# Patient Record
Sex: Female | Born: 1950 | Race: White | Hispanic: No | Marital: Married | Smoking: Never smoker
Health system: Southern US, Community
[De-identification: ages and names within clinical notes are randomized; demographics above are authoritative.]

## PROBLEM LIST (undated history)

## (undated) DIAGNOSIS — M069 Rheumatoid arthritis, unspecified: Secondary | ICD-10-CM

## (undated) DIAGNOSIS — B029 Zoster without complications: Secondary | ICD-10-CM

## (undated) DIAGNOSIS — M419 Scoliosis, unspecified: Secondary | ICD-10-CM

## (undated) DIAGNOSIS — K219 Gastro-esophageal reflux disease without esophagitis: Secondary | ICD-10-CM

## (undated) DIAGNOSIS — I10 Essential (primary) hypertension: Secondary | ICD-10-CM

## (undated) DIAGNOSIS — K623 Rectal prolapse: Secondary | ICD-10-CM

## (undated) DIAGNOSIS — M8580 Other specified disorders of bone density and structure, unspecified site: Secondary | ICD-10-CM

## (undated) DIAGNOSIS — S42401A Unspecified fracture of lower end of right humerus, initial encounter for closed fracture: Secondary | ICD-10-CM

## (undated) DIAGNOSIS — N390 Urinary tract infection, site not specified: Secondary | ICD-10-CM

## (undated) DIAGNOSIS — M1990 Unspecified osteoarthritis, unspecified site: Secondary | ICD-10-CM

## (undated) HISTORY — DX: Urinary tract infection, site not specified: N39.0

## (undated) HISTORY — DX: Rectal prolapse: K62.3

## (undated) HISTORY — DX: Gastro-esophageal reflux disease without esophagitis: K21.9

## (undated) HISTORY — DX: Scoliosis, unspecified: M41.9

## (undated) HISTORY — DX: Rheumatoid arthritis, unspecified: M06.9

## (undated) HISTORY — PX: ESOPHAGOGASTRODUODENOSCOPY: SHX1529

## (undated) HISTORY — DX: Zoster without complications: B02.9

## (undated) HISTORY — DX: Other specified disorders of bone density and structure, unspecified site: M85.80

## (undated) HISTORY — PX: COLONOSCOPY: SHX174

---

## 2008-04-28 ENCOUNTER — Encounter: Admission: RE | Admit: 2008-04-28 | Discharge: 2008-04-28 | Payer: Self-pay | Admitting: Internal Medicine

## 2009-05-01 ENCOUNTER — Encounter: Admission: RE | Admit: 2009-05-01 | Discharge: 2009-05-01 | Payer: Self-pay | Admitting: *Deleted

## 2010-03-26 ENCOUNTER — Other Ambulatory Visit: Payer: Self-pay | Admitting: Internal Medicine

## 2010-03-26 DIAGNOSIS — Z1239 Encounter for other screening for malignant neoplasm of breast: Secondary | ICD-10-CM

## 2010-05-06 ENCOUNTER — Ambulatory Visit
Admission: RE | Admit: 2010-05-06 | Discharge: 2010-05-06 | Disposition: A | Discharge status: Home / Self Care | Payer: BC Managed Care – PPO | Source: Ambulatory Visit | Attending: Internal Medicine | Admitting: Internal Medicine

## 2010-05-06 DIAGNOSIS — Z1239 Encounter for other screening for malignant neoplasm of breast: Secondary | ICD-10-CM

## 2011-04-02 ENCOUNTER — Other Ambulatory Visit: Payer: Self-pay | Admitting: Internal Medicine

## 2011-04-02 DIAGNOSIS — Z1231 Encounter for screening mammogram for malignant neoplasm of breast: Secondary | ICD-10-CM

## 2011-05-07 ENCOUNTER — Ambulatory Visit: Payer: BC Managed Care – PPO

## 2011-05-14 ENCOUNTER — Ambulatory Visit
Admission: RE | Admit: 2011-05-14 | Discharge: 2011-05-14 | Disposition: A | Discharge status: Home / Self Care | Payer: BC Managed Care – PPO | Source: Ambulatory Visit | Attending: Internal Medicine | Admitting: Internal Medicine

## 2011-05-14 DIAGNOSIS — Z1231 Encounter for screening mammogram for malignant neoplasm of breast: Secondary | ICD-10-CM

## 2012-04-07 ENCOUNTER — Other Ambulatory Visit: Payer: Self-pay | Admitting: Internal Medicine

## 2012-04-07 DIAGNOSIS — Z1231 Encounter for screening mammogram for malignant neoplasm of breast: Secondary | ICD-10-CM

## 2012-05-17 ENCOUNTER — Ambulatory Visit
Admission: RE | Admit: 2012-05-17 | Discharge: 2012-05-17 | Disposition: A | Discharge status: Home / Self Care | Payer: BC Managed Care – PPO | Source: Ambulatory Visit | Attending: Internal Medicine | Admitting: Internal Medicine

## 2012-05-17 DIAGNOSIS — Z1231 Encounter for screening mammogram for malignant neoplasm of breast: Secondary | ICD-10-CM

## 2013-04-20 ENCOUNTER — Other Ambulatory Visit: Payer: Self-pay

## 2013-04-20 DIAGNOSIS — Z1231 Encounter for screening mammogram for malignant neoplasm of breast: Secondary | ICD-10-CM

## 2013-05-18 ENCOUNTER — Ambulatory Visit
Admission: RE | Admit: 2013-05-18 | Discharge: 2013-05-18 | Disposition: A | Discharge status: Home / Self Care | Payer: BC Managed Care – PPO | Source: Ambulatory Visit

## 2013-05-18 DIAGNOSIS — Z1231 Encounter for screening mammogram for malignant neoplasm of breast: Secondary | ICD-10-CM

## 2014-04-18 ENCOUNTER — Other Ambulatory Visit: Payer: Self-pay

## 2014-04-18 DIAGNOSIS — Z1231 Encounter for screening mammogram for malignant neoplasm of breast: Secondary | ICD-10-CM

## 2014-05-30 ENCOUNTER — Ambulatory Visit
Admission: RE | Admit: 2014-05-30 | Discharge: 2014-05-30 | Disposition: A | Discharge status: Home / Self Care | Payer: BC Managed Care – PPO | Source: Ambulatory Visit

## 2014-05-30 ENCOUNTER — Encounter (INDEPENDENT_AMBULATORY_CARE_PROVIDER_SITE_OTHER): Payer: Self-pay

## 2014-05-30 DIAGNOSIS — Z1231 Encounter for screening mammogram for malignant neoplasm of breast: Secondary | ICD-10-CM

## 2014-10-17 ENCOUNTER — Other Ambulatory Visit: Payer: Self-pay | Admitting: Gastroenterology

## 2014-10-17 DIAGNOSIS — Z1211 Encounter for screening for malignant neoplasm of colon: Secondary | ICD-10-CM

## 2014-11-10 ENCOUNTER — Other Ambulatory Visit: Payer: BC Managed Care – PPO

## 2015-04-25 ENCOUNTER — Other Ambulatory Visit: Payer: Self-pay

## 2015-04-25 DIAGNOSIS — Z1231 Encounter for screening mammogram for malignant neoplasm of breast: Secondary | ICD-10-CM

## 2015-06-06 ENCOUNTER — Ambulatory Visit
Admission: RE | Admit: 2015-06-06 | Discharge: 2015-06-06 | Disposition: A | Discharge status: Home / Self Care | Payer: BC Managed Care – PPO | Source: Ambulatory Visit

## 2015-06-06 DIAGNOSIS — Z1231 Encounter for screening mammogram for malignant neoplasm of breast: Secondary | ICD-10-CM

## 2015-06-07 ENCOUNTER — Ambulatory Visit: Payer: BC Managed Care – PPO

## 2016-02-04 ENCOUNTER — Encounter (HOSPITAL_COMMUNITY): Payer: Self-pay

## 2016-02-04 ENCOUNTER — Emergency Department (HOSPITAL_COMMUNITY)
Admission: EM | Admit: 2016-02-04 | Discharge: 2016-02-04 | Disposition: A | Discharge status: Home / Self Care | Payer: BC Managed Care – PPO | Attending: Emergency Medicine | Admitting: Emergency Medicine

## 2016-02-04 ENCOUNTER — Emergency Department (HOSPITAL_COMMUNITY): Payer: BC Managed Care – PPO

## 2016-02-04 DIAGNOSIS — S6991XA Unspecified injury of right wrist, hand and finger(s), initial encounter: Secondary | ICD-10-CM | POA: Diagnosis present

## 2016-02-04 DIAGNOSIS — W1839XA Other fall on same level, initial encounter: Secondary | ICD-10-CM | POA: Diagnosis not present

## 2016-02-04 DIAGNOSIS — S61412A Laceration without foreign body of left hand, initial encounter: Secondary | ICD-10-CM | POA: Diagnosis not present

## 2016-02-04 DIAGNOSIS — Y9389 Activity, other specified: Secondary | ICD-10-CM | POA: Insufficient documentation

## 2016-02-04 DIAGNOSIS — I10 Essential (primary) hypertension: Secondary | ICD-10-CM | POA: Diagnosis not present

## 2016-02-04 DIAGNOSIS — Y9289 Other specified places as the place of occurrence of the external cause: Secondary | ICD-10-CM | POA: Diagnosis not present

## 2016-02-04 DIAGNOSIS — Y999 Unspecified external cause status: Secondary | ICD-10-CM | POA: Insufficient documentation

## 2016-02-04 DIAGNOSIS — S52601S Unspecified fracture of lower end of right ulna, sequela: Secondary | ICD-10-CM

## 2016-02-04 DIAGNOSIS — S52691A Other fracture of lower end of right ulna, initial encounter for closed fracture: Secondary | ICD-10-CM | POA: Diagnosis not present

## 2016-02-04 HISTORY — DX: Unspecified osteoarthritis, unspecified site: M19.90

## 2016-02-04 HISTORY — DX: Essential (primary) hypertension: I10

## 2016-02-04 MED ORDER — IBUPROFEN 400 MG PO TABS
400.0000 mg | ORAL_TABLET | Freq: Once | ORAL | Status: DC
Start: 2016-02-04 — End: 2016-02-04

## 2016-02-04 MED ORDER — ACETAMINOPHEN 325 MG PO TABS
650.0000 mg | ORAL_TABLET | Freq: Once | ORAL | Status: AC
  Administered 2016-02-04: 650 mg via ORAL
  Filled 2016-02-04: qty 2

## 2016-02-04 NOTE — ED Triage Notes (Signed)
Pt.S husband  Fel off a ladder and pt.S was attempting to help him and she fell backward trying to avoid getting hit by the ladder. Rt, hip pain, rt. Wrist pain and lt. Fingers are stiff.   Pt. Denies hitting her head.  No visible injuries noted.  Pt. Has 2 lt. Thumb skin tears.

## 2016-02-04 NOTE — ED Provider Notes (Signed)
[facility] DEPT Provider Note   CSN: [id number] Arrival date & time: 02/04/16  1508     History   Chief Complaint Chief Complaint  Patient presents with  . Fall    HPI Sylvia Reynolds is a 65 y.o. female hx of HTN, Who presented with fall. Patient states that she was helping her husband with some roof work. She states that her husband fell from the roof and she was holding the ladder down there and they both fell and she landed on outstretched hand and right hip. She denies passing out or lost consciousness or head injury. She states that she has chronic pain from her arthritis but she does have some right hip pain and back pain when she walks. She also noticed skin tears on the left hand and right wrist pain. Patient states that her tetanus is up-to-date.   The history is provided by the patient.    Past Medical History:  Diagnosis Date  . Arthritis   . Hypertension     There are no active problems to display for this patient.   History reviewed. No pertinent surgical history.  OB History    No data available       Home Medications    Prior to Admission medications   Not on File    Family History No family history on file.  Social History Social History  Substance Use Topics  . Smoking status: Never Smoker  . Smokeless tobacco: Never Used  . Alcohol use Yes     Comment: occasional     Allergies   Patient has no known allergies.   Review of Systems Review of Systems  Musculoskeletal:       R hip and back pain   Skin: Positive for wound.  All other systems reviewed and are negative.    Physical Exam Updated Vital Signs BP 161/76 (BP Location: Left Arm)   Pulse 69   Temp 98 F (36.7 C) (Oral)   Resp 17   Ht 5' (1.524 m)   Wt 115 lb (52.2 kg)   SpO2 100%   BMI 22.46 kg/m   Physical Exam  Constitutional: She is oriented to person, place, and time. She appears well-developed and well-nourished.  HENT:  Head: Normocephalic and atraumatic.   Eyes: EOM are normal. Pupils are equal, round, and reactive to light.  Neck: Normal range of motion. Neck supple.  Cardiovascular: Normal rate, regular rhythm and normal heart sounds.   Pulmonary/Chest: Effort normal and breath sounds normal. No respiratory distress. She has no wheezes. She has no rales.  Abdominal: Soft. Bowel sounds are normal. She exhibits no distension. There is no tenderness. There is no guarding.  Musculoskeletal:  L hand small skin tears that is not bleeding. R wrist mild tenderness, no obvious deformity. R paralumbar tenderness. Able to ambulate, nl ROM R hip   Neurological: She is alert and oriented to person, place, and time.  Skin: Skin is warm.  Psychiatric: She has a normal mood and affect.  Nursing note and vitals reviewed.    ED Treatments / Results  Labs (all labs ordered are listed, but only abnormal results are displayed) Labs Reviewed - No data to display  EKG  EKG Interpretation None       Radiology Dg Lumbar Spine Complete  Result Date: 02/04/2016 CLINICAL DATA:  Pt was holding latter for her husband when he fell off the latter. She fell back trying to get out of the way. She landed  on her lower back first. She mentioned feeling a lot of pain in her right buttock. She also landed with her hands out behind her hurting her right wrist which she said was beginning to hurt worse when she was brought in for the exam. He left hand has an abrasion around the MCP jt of the 2nd digit. She believe something may have fallen on or scrapped her left hand. EXAM: LUMBAR SPINE - COMPLETE 4+ VIEW COMPARISON:  None. FINDINGS: There is a grade 2 anterolisthesis of L5 on S1. There is no convincing pars defects although this is not excluded. The anterolisthesis is most likely degenerative in origin. There is a moderate levoscoliosis, apex at L2 with a dextroscoliosis, apex at L5. Mild loss of disc height at T12-L1. Moderate loss disc height at L1-L2. Marked loss of  disc height at L2-L3. Moderate to marked loss of disc height at L3-L4 and L4-L5. Moderate loss of disc height at L5-S1. Bones are diffusely demineralized. Soft tissues are unremarkable. IMPRESSION: 1. No fracture or convincing acute abnormality. 2. Scoliosis and significant degenerative changes as detailed above. Electronically Signed   By: Lajean Manes M.D.   On: 02/04/2016 18:14   Dg Wrist Complete Right  Result Date: 02/04/2016 CLINICAL DATA:  Pt was holding latter for her husband when he fell off the latter. She fell back trying to get out of the way. She landed on her lower back first. She mentioned feeling a lot of pain in her right buttock. She also landed with her hands out behind her hurting her right wrist which she said was beginning to hurt worse when she was brought in for the exam. He left hand has an abrasion around the MCP jt of the 2nd digit. She believe something may have fallen on or scrapped her left hand. EXAM: RIGHT WRIST - COMPLETE 3+ VIEW COMPARISON:  None. FINDINGS: No convincing acute fracture.  No dislocation. There is narrowing of the radial scaphoid and radiolunate joints with mild subchondral sclerosis. There is some sclerosis in remottling at the radioulnar articulation. There is mild cystic change in distal radius and the ulna. The bones are diffusely demineralized. There is evidence of soft tissue swelling over the dorsal aspect of the wrist. IMPRESSION: 1. No convincing acute fracture.  No dislocation. 2. Degenerative changes of the radiocarpal joint and at the radial ulnar articulation. Some deformity of the distal ulna suggests a possible old, healed fracture. Electronically Signed   By: Lajean Manes M.D.   On: 02/04/2016 18:10   Dg Hand Complete Left  Result Date: 02/04/2016 CLINICAL DATA:  Pt was holding latter for her husband when he fell off the latter. She fell back trying to get out of the way. She landed on her lower back first. She mentioned feeling a lot of pain  in her right buttock. She also landed with her hands out behind her hurting her right wrist which she said was beginning to hurt worse when she was brought in for the exam. He left hand has an abrasion around the MCP jt of the 2nd digit. She believe something may have fallen on or scrapped her left hand. EXAM: LEFT HAND - COMPLETE 3+ VIEW COMPARISON:  None. FINDINGS: No acute fracture. Deformity of the distal fifth metacarpal is likely from an old healed fracture. No dislocation. There is narrowing and subchondral sclerosis at the first carpometacarpal articulation consistent with osteoarthritis. Bones are demineralized.  The soft tissues are unremarkable. IMPRESSION: No acute fracture, dislocation or  acute finding. Electronically Signed   By: Lajean Manes M.D.   On: 02/04/2016 18:07   Dg Hip Unilat W Or Wo Pelvis 2-3 Views Right  Result Date: 02/04/2016 CLINICAL DATA:  Pt was holding latter for her husband when he fell off the latter. She fell back trying to get out of the way. She landed on her lower back first. She mentioned feeling a lot of pain in her right buttock. She also landed with her hands out behind her hurting her right wrist which she said was beginning to hurt worse when she was brought in for the exam. He left hand has an abrasion around the MCP jt of the 2nd digit. She believe something may have fallen on or scrapped her left hand. EXAM: DG HIP (WITH OR WITHOUT PELVIS) 2-3V RIGHT COMPARISON:  None. FINDINGS: No fracture.  No bone lesion. Hip joints, SI joints and symphysis pubis are normally spaced and aligned. No significant arthropathic change. Bones are diffusely demineralized. Soft tissues are unremarkable. IMPRESSION: 1. No fracture or dislocation. Electronically Signed   By: Lajean Manes M.D.   On: 02/04/2016 18:15    Procedures Procedures (including critical care time)  Medications Ordered in ED Medications - No data to display   Initial Impression / Assessment and Plan / ED  Course  I have reviewed the triage vital signs and the nursing notes.  Pertinent labs & imaging results that were available during my care of the patient were reviewed by me and considered in my medical decision making (see chart for details).  Clinical Course     IGNACIA CRISS is a 65 y.o. female here with fall. I think likely contusion. No head injury. Will get xrays. Tdap up to date.  6:53 PM Xray showed old R ulna fracture. Given wrist splint for comfort. L thumb skin tears cleaned by nursing. Recommend tylenol, motrin, ice. Will dc home.    Final Clinical Impressions(s) / ED Diagnoses   Final diagnoses:  None    New Prescriptions New Prescriptions   No medications on file     Drenda Freeze, MD 02/04/16 [phone]

## 2016-02-04 NOTE — Progress Notes (Signed)
Orthopedic Tech Progress Note Patient Details:  Sylvia Reynolds 10-Jul-1950 [id number]  Ortho Devices Type of Ortho Device: Velcro wrist splint Ortho Device/Splint Location: LUE Ortho Device/Splint Interventions: Ordered, Application   Braulio Bosch 02/04/2016, 7:26 PM

## 2016-02-04 NOTE — ED Notes (Signed)
Pt walking up and down hallway appears in no distress

## 2016-02-04 NOTE — Discharge Instructions (Signed)
You had previous ulna fracture that is healed and no new fractures currently.  Wear the wrist splint for comfort.   Take motrin for pain. Apply ice on it.   See your doctor in a week if you still have pain and you may need repeat xray.  Keep  your wounds clean and dry.   Return to ER if you have severe pain, headaches, vomiting, unable to walk

## 2016-05-12 ENCOUNTER — Other Ambulatory Visit: Payer: Self-pay | Admitting: Internal Medicine

## 2016-05-12 DIAGNOSIS — Z1231 Encounter for screening mammogram for malignant neoplasm of breast: Secondary | ICD-10-CM

## 2016-05-27 ENCOUNTER — Ambulatory Visit: Payer: Self-pay | Admitting: Women's Health

## 2016-06-04 ENCOUNTER — Ambulatory Visit (INDEPENDENT_AMBULATORY_CARE_PROVIDER_SITE_OTHER): Payer: BC Managed Care – PPO | Admitting: Women's Health

## 2016-06-04 ENCOUNTER — Encounter: Payer: Self-pay | Admitting: Women's Health

## 2016-06-04 VITALS — BP 152/98 | Ht 59.75 in | Wt 120.6 lb

## 2016-06-04 DIAGNOSIS — Z01419 Encounter for gynecological examination (general) (routine) without abnormal findings: Secondary | ICD-10-CM

## 2016-06-04 NOTE — Progress Notes (Signed)
Sylvia Reynolds [birth year] [id number]    History:    66 year old new patient presents for annual exam. Postmenopausal on no HRT with no bleeding. Normal pap and mammography history. 2017 DEXA at primary care. 2016 colonoscopy normal. Received Pneumovax but not Zostavax because on Methotrexate for RA. Not  sexually active, husbands health.  Hypertension managed by primary care.  Past medical history, past surgical history, family history and social history were all reviewed and documented in the EPIC chart. Married for 43 years. Has twin girls 64 years old, Judson Roch and Benjamine Mola. Judson Roch expecting child in June, Elizabeth with cerebral palsy, W/C bound lives in a group home with help. Lost 27 pounds years ago on [employer]. Retired Pharmacist, hospital.  ROS:  A ROS was performed and pertinent positives and negatives are included.  Exam:  Vitals:   06/04/16 1055  BP: (!) 152/98  Weight: 120 lb 9.6 oz (54.7 kg)  Height: 4' 11.75" (1.518 m)   Body mass index is 23.75 kg/m.   General appearance:  Normal Thyroid:  Symmetrical, normal in size, without palpable masses or nodularity. Respiratory  Auscultation:  Clear without wheezing or rhonchi Cardiovascular  Auscultation:  Regular rate, without rubs, murmurs or gallops  Edema/varicosities:  Not grossly evident Abdominal  Soft,nontender, without masses, guarding or rebound.  Liver/spleen:  No organomegaly noted  Hernia:  None appreciated  Skin  Inspection:  Grossly normal   Breasts: Examined lying and sitting.     Right: Without masses, retractions, discharge or axillary adenopathy.     Left: Without masses, retractions, discharge or axillary adenopathy. Gentitourinary   Inguinal/mons:  Normal without inguinal adenopathy  External genitalia:  Normal  BUS/Urethra/Skene's glands:  Normal  Vagina:  Atrophic/asymptomatic  Cervix:  Normal  Uterus:  Normal in size, shape and contour.  Midline and mobile  Adnexa/parametria:     Rt: Without masses or  tenderness.   Lt: Without masses or tenderness.  Anus and perineum: Normal  Digital rectal exam: Normal sphincter tone without palpated masses or tenderness  Assessment/Plan:  66 y.o. MWF G1P2 for annual exam with no complaints  Postmenopausal/no HRT/no bleeding Rheumatoid Arthritis on methotrexate per rheumatologist Osteopenia/Hypertension labs and meds managed by primary care  Plan: Reviewed blood pressure elevated, will check at home follow-up with primary care. SBE's, continue annual 3-D screening mammogram history of dense breasts. Home safety, fall prevention and importance of weightbearing exercise reviewed. Vitamin D 2000 daily encouraged. Pap normal 2017, new screening guidelines reviewed. T dap recommended.    Huel Cote WHNP, 12:01 PM 06/04/2016

## 2016-06-04 NOTE — Patient Instructions (Signed)
Health Maintenance for Postmenopausal Women Menopause is a normal process in which your reproductive ability comes to an end. This process happens gradually over a span of months to years, usually between the ages of 33 and 38. Menopause is complete when you have missed 12 consecutive menstrual periods. It is important to talk with your health care provider about some of the most common conditions that affect postmenopausal women, such as heart disease, cancer, and bone loss (osteoporosis). Adopting a healthy lifestyle and getting preventive care can help to promote your health and wellness. Those actions can also lower your chances of developing some of these common conditions. What should I know about menopause? During menopause, you may experience a number of symptoms, such as:  Moderate-to-severe hot flashes.  Night sweats.  Decrease in sex drive.  Mood swings.  Headaches.  Tiredness.  Irritability.  Memory problems.  Insomnia. Choosing to treat or not to treat menopausal changes is an individual decision that you make with your health care provider. What should I know about hormone replacement therapy and supplements? Hormone therapy products are effective for treating symptoms that are associated with menopause, such as hot flashes and night sweats. Hormone replacement carries certain risks, especially as you become older. If you are thinking about using estrogen or estrogen with progestin treatments, discuss the benefits and risks with your health care provider. What should I know about heart disease and stroke? Heart disease, heart attack, and stroke become more likely as you age. This may be due, in part, to the hormonal changes that your body experiences during menopause. These can affect how your body processes dietary fats, triglycerides, and cholesterol. Heart attack and stroke are both medical emergencies. There are many things that you can do to help prevent heart disease  and stroke:  Have your blood pressure checked at least every 1-2 years. High blood pressure causes heart disease and increases the risk of stroke.  If you are 48-61 years old, ask your health care provider if you should take aspirin to prevent a heart attack or a stroke.  Do not use any tobacco products, including cigarettes, chewing tobacco, or electronic cigarettes. If you need help quitting, ask your health care provider.  It is important to eat a healthy diet and maintain a healthy weight.  Be sure to include plenty of vegetables, fruits, low-fat dairy products, and lean protein.  Avoid eating foods that are high in solid fats, added sugars, or salt (sodium).  Get regular exercise. This is one of the most important things that you can do for your health.  Try to exercise for at least 150 minutes each week. The type of exercise that you do should increase your heart rate and make you sweat. This is known as moderate-intensity exercise.  Try to do strengthening exercises at least twice each week. Do these in addition to the moderate-intensity exercise.  Know your numbers.Ask your health care provider to check your cholesterol and your blood glucose. Continue to have your blood tested as directed by your health care provider. What should I know about cancer screening? There are several types of cancer. Take the following steps to reduce your risk and to catch any cancer development as early as possible. Breast Cancer  Practice breast self-awareness.  This means understanding how your breasts normally appear and feel.  It also means doing regular breast self-exams. Let your health care provider know about any changes, no matter how small.  If you are 40 or older,  have a clinician do a breast exam (clinical breast exam or CBE) every year. Depending on your age, family history, and medical history, it may be recommended that you also have a yearly breast X-ray (mammogram).  If you  have a family history of breast cancer, talk with your health care provider about genetic screening.  If you are at high risk for breast cancer, talk with your health care provider about having an MRI and a mammogram every year.  Breast cancer (BRCA) gene test is recommended for women who have family members with BRCA-related cancers. Results of the assessment will determine the need for genetic counseling and BRCA1 and for BRCA2 testing. BRCA-related cancers include these types:  Breast. This occurs in males or females.  Ovarian.  Tubal. This may also be called fallopian tube cancer.  Cancer of the abdominal or pelvic lining (peritoneal cancer).  Prostate.  Pancreatic. Cervical, Uterine, and Ovarian Cancer  Your health care provider may recommend that you be screened regularly for cancer of the pelvic organs. These include your ovaries, uterus, and vagina. This screening involves a pelvic exam, which includes checking for microscopic changes to the surface of your cervix (Pap test).  For women ages 21-65, health care providers may recommend a pelvic exam and a Pap test every three years. For women ages 23-65, they may recommend the Pap test and pelvic exam, combined with testing for human papilloma virus (HPV), every five years. Some types of HPV increase your risk of cervical cancer. Testing for HPV may also be done on women of any age who have unclear Pap test results.  Other health care providers may not recommend any screening for nonpregnant women who are considered low risk for pelvic cancer and have no symptoms. Ask your health care provider if a screening pelvic exam is right for you.  If you have had past treatment for cervical cancer or a condition that could lead to cancer, you need Pap tests and screening for cancer for at least 20 years after your treatment. If Pap tests have been discontinued for you, your risk factors (such as having a new sexual partner) need to be reassessed  to determine if you should start having screenings again. Some women have medical problems that increase the chance of getting cervical cancer. In these cases, your health care provider may recommend that you have screening and Pap tests more often.  If you have a family history of uterine cancer or ovarian cancer, talk with your health care provider about genetic screening.  If you have vaginal bleeding after reaching menopause, tell your health care provider.  There are currently no reliable tests available to screen for ovarian cancer. Lung Cancer  Lung cancer screening is recommended for adults 99-83 years old who are at high risk for lung cancer because of a history of smoking. A yearly low-dose CT scan of the lungs is recommended if you:  Currently smoke.  Have a history of at least 30 pack-years of smoking and you currently smoke or have quit within the past 15 years. A pack-year is smoking an average of one pack of cigarettes per day for one year. Yearly screening should:  Continue until it has been 15 years since you quit.  Stop if you develop a health problem that would prevent you from having lung cancer treatment. Colorectal Cancer  This type of cancer can be detected and can often be prevented.  Routine colorectal cancer screening usually begins at age 72 and continues  through age 75.  If you have risk factors for colon cancer, your health care provider may recommend that you be screened at an earlier age.  If you have a family history of colorectal cancer, talk with your health care provider about genetic screening.  Your health care provider may also recommend using home test kits to check for hidden blood in your stool.  A small camera at the end of a tube can be used to examine your colon directly (sigmoidoscopy or colonoscopy). This is done to check for the earliest forms of colorectal cancer.  Direct examination of the colon should be repeated every 5-10 years until  age 75. However, if early forms of precancerous polyps or small growths are found or if you have a family history or genetic risk for colorectal cancer, you may need to be screened more often. Skin Cancer  Check your skin from head to toe regularly.  Monitor any moles. Be sure to tell your health care provider:  About any new moles or changes in moles, especially if there is a change in a mole's shape or color.  If you have a mole that is larger than the size of a pencil eraser.  If any of your family members has a history of skin cancer, especially at a young age, talk with your health care provider about genetic screening.  Always use sunscreen. Apply sunscreen liberally and repeatedly throughout the day.  Whenever you are outside, protect yourself by wearing [place] sleeves, pants, a wide-brimmed hat, and sunglasses. What should I know about osteoporosis? Osteoporosis is a condition in which bone destruction happens more quickly than new bone creation. After menopause, you may be at an increased risk for osteoporosis. To help prevent osteoporosis or the bone fractures that can happen because of osteoporosis, the following is recommended:  If you are 19-50 years old, get at least 1,000 mg of calcium and at least 600 mg of vitamin D per day.  If you are older than age 50 but younger than age 70, get at least 1,200 mg of calcium and at least 600 mg of vitamin D per day.  If you are older than age 70, get at least 1,200 mg of calcium and at least 800 mg of vitamin D per day. Smoking and excessive alcohol intake increase the risk of osteoporosis. Eat foods that are rich in calcium and vitamin D, and do weight-bearing exercises several times each week as directed by your health care provider. What should I know about how menopause affects my mental health? Depression may occur at any age, but it is more common as you become older. Common symptoms of depression include:  Low or sad  mood.  Changes in sleep patterns.  Changes in appetite or eating patterns.  Feeling an overall lack of motivation or enjoyment of activities that you previously enjoyed.  Frequent crying spells. Talk with your health care provider if you think that you are experiencing depression. What should I know about immunizations? It is important that you get and maintain your immunizations. These include:  Tetanus, diphtheria, and pertussis (Tdap) booster vaccine.  Influenza every year before the flu season begins.  Pneumonia vaccine.  Shingles vaccine. Your health care provider may also recommend other immunizations. This information is not intended to replace advice given to you by your health care provider. Make sure you discuss any questions you have with your health care provider. Document Released: 04/11/2005 Document Revised: 09/07/2015 Document Reviewed: 11/21/2014 [employer] Interactive Patient   Education  2017 [employer]  

## 2016-06-10 ENCOUNTER — Ambulatory Visit
Admission: RE | Admit: 2016-06-10 | Discharge: 2016-06-10 | Disposition: A | Discharge status: Home / Self Care | Payer: BC Managed Care – PPO | Source: Ambulatory Visit | Attending: Internal Medicine | Admitting: Internal Medicine

## 2016-06-10 DIAGNOSIS — Z1231 Encounter for screening mammogram for malignant neoplasm of breast: Secondary | ICD-10-CM

## 2016-06-11 ENCOUNTER — Encounter: Payer: Self-pay | Admitting: Women's Health

## 2016-07-16 ENCOUNTER — Encounter: Payer: Self-pay | Admitting: Gynecology

## 2017-04-30 ENCOUNTER — Other Ambulatory Visit: Payer: Self-pay | Admitting: Internal Medicine

## 2017-04-30 DIAGNOSIS — Z1231 Encounter for screening mammogram for malignant neoplasm of breast: Secondary | ICD-10-CM

## 2017-06-10 ENCOUNTER — Ambulatory Visit: Payer: BC Managed Care – PPO | Admitting: Women's Health

## 2017-06-10 ENCOUNTER — Encounter: Payer: Self-pay | Admitting: Women's Health

## 2017-06-10 VITALS — BP 134/80 | Ht 59.3 in | Wt 120.0 lb

## 2017-06-10 DIAGNOSIS — Z01419 Encounter for gynecological examination (general) (routine) without abnormal findings: Secondary | ICD-10-CM | POA: Diagnosis not present

## 2017-06-10 NOTE — Patient Instructions (Signed)
Health Maintenance for Postmenopausal Women Menopause is a normal process in which your reproductive ability comes to an end. This process happens gradually over a span of months to years, usually between the ages of 22 and 9. Menopause is complete when you have missed 12 consecutive menstrual periods. It is important to talk with your health care provider about some of the most common conditions that affect postmenopausal women, such as heart disease, cancer, and bone loss (osteoporosis). Adopting a healthy lifestyle and getting preventive care can help to promote your health and wellness. Those actions can also lower your chances of developing some of these common conditions. What should I know about menopause? During menopause, you may experience a number of symptoms, such as:  Moderate-to-severe hot flashes.  Night sweats.  Decrease in sex drive.  Mood swings.  Headaches.  Tiredness.  Irritability.  Memory problems.  Insomnia.  Choosing to treat or not to treat menopausal changes is an individual decision that you make with your health care provider. What should I know about hormone replacement therapy and supplements? Hormone therapy products are effective for treating symptoms that are associated with menopause, such as hot flashes and night sweats. Hormone replacement carries certain risks, especially as you become older. If you are thinking about using estrogen or estrogen with progestin treatments, discuss the benefits and risks with your health care provider. What should I know about heart disease and stroke? Heart disease, heart attack, and stroke become more likely as you age. This may be due, in part, to the hormonal changes that your body experiences during menopause. These can affect how your body processes dietary fats, triglycerides, and cholesterol. Heart attack and stroke are both medical emergencies. There are many things that you can do to help prevent heart disease  and stroke:  Have your blood pressure checked at least every 1-2 years. High blood pressure causes heart disease and increases the risk of stroke.  If you are 53-22 years old, ask your health care provider if you should take aspirin to prevent a heart attack or a stroke.  Do not use any tobacco products, including cigarettes, chewing tobacco, or electronic cigarettes. If you need help quitting, ask your health care provider.  It is important to eat a healthy diet and maintain a healthy weight. ? Be sure to include plenty of vegetables, fruits, low-fat dairy products, and lean protein. ? Avoid eating foods that are high in solid fats, added sugars, or salt (sodium).  Get regular exercise. This is one of the most important things that you can do for your health. ? Try to exercise for at least 150 minutes each week. The type of exercise that you do should increase your heart rate and make you sweat. This is known as moderate-intensity exercise. ? Try to do strengthening exercises at least twice each week. Do these in addition to the moderate-intensity exercise.  Know your numbers.Ask your health care provider to check your cholesterol and your blood glucose. Continue to have your blood tested as directed by your health care provider.  What should I know about cancer screening? There are several types of cancer. Take the following steps to reduce your risk and to catch any cancer development as early as possible. Breast Cancer  Practice breast self-awareness. ? This means understanding how your breasts normally appear and feel. ? It also means doing regular breast self-exams. Let your health care provider know about any changes, no matter how small.  If you are 40  or older, have a clinician do a breast exam (clinical breast exam or CBE) every year. Depending on your age, family history, and medical history, it may be recommended that you also have a yearly breast X-ray (mammogram).  If you  have a family history of breast cancer, talk with your health care provider about genetic screening.  If you are at high risk for breast cancer, talk with your health care provider about having an MRI and a mammogram every year.  Breast cancer (BRCA) gene test is recommended for women who have family members with BRCA-related cancers. Results of the assessment will determine the need for genetic counseling and BRCA1 and for BRCA2 testing. BRCA-related cancers include these types: ? Breast. This occurs in males or females. ? Ovarian. ? Tubal. This may also be called fallopian tube cancer. ? Cancer of the abdominal or pelvic lining (peritoneal cancer). ? Prostate. ? Pancreatic.  Cervical, Uterine, and Ovarian Cancer Your health care provider may recommend that you be screened regularly for cancer of the pelvic organs. These include your ovaries, uterus, and vagina. This screening involves a pelvic exam, which includes checking for microscopic changes to the surface of your cervix (Pap test).  For women ages 21-65, health care providers may recommend a pelvic exam and a Pap test every three years. For women ages 79-65, they may recommend the Pap test and pelvic exam, combined with testing for human papilloma virus (HPV), every five years. Some types of HPV increase your risk of cervical cancer. Testing for HPV may also be done on women of any age who have unclear Pap test results.  Other health care providers may not recommend any screening for nonpregnant women who are considered low risk for pelvic cancer and have no symptoms. Ask your health care provider if a screening pelvic exam is right for you.  If you have had past treatment for cervical cancer or a condition that could lead to cancer, you need Pap tests and screening for cancer for at least 20 years after your treatment. If Pap tests have been discontinued for you, your risk factors (such as having a new sexual partner) need to be  reassessed to determine if you should start having screenings again. Some women have medical problems that increase the chance of getting cervical cancer. In these cases, your health care provider may recommend that you have screening and Pap tests more often.  If you have a family history of uterine cancer or ovarian cancer, talk with your health care provider about genetic screening.  If you have vaginal bleeding after reaching menopause, tell your health care provider.  There are currently no reliable tests available to screen for ovarian cancer.  Lung Cancer Lung cancer screening is recommended for adults 69-62 years old who are at high risk for lung cancer because of a history of smoking. A yearly low-dose CT scan of the lungs is recommended if you:  Currently smoke.  Have a history of at least 30 pack-years of smoking and you currently smoke or have quit within the past 15 years. A pack-year is smoking an average of one pack of cigarettes per day for one year.  Yearly screening should:  Continue until it has been 15 years since you quit.  Stop if you develop a health problem that would prevent you from having lung cancer treatment.  Colorectal Cancer  This type of cancer can be detected and can often be prevented.  Routine colorectal cancer screening usually begins at  age 56 and continues through age 84.  If you have risk factors for colon cancer, your health care provider may recommend that you be screened at an earlier age.  If you have a family history of colorectal cancer, talk with your health care provider about genetic screening.  Your health care provider may also recommend using home test kits to check for hidden blood in your stool.  A small camera at the end of a tube can be used to examine your colon directly (sigmoidoscopy or colonoscopy). This is done to check for the earliest forms of colorectal cancer.  Direct examination of the colon should be repeated every  5-10 years until age 53. However, if early forms of precancerous polyps or small growths are found or if you have a family history or genetic risk for colorectal cancer, you may need to be screened more often.  Skin Cancer  Check your skin from head to toe regularly.  Monitor any moles. Be sure to tell your health care provider: ? About any new moles or changes in moles, especially if there is a change in a mole's shape or color. ? If you have a mole that is larger than the size of a pencil eraser.  If any of your family members has a history of skin cancer, especially at a Kobie Matkins age, talk with your health care provider about genetic screening.  Always use sunscreen. Apply sunscreen liberally and repeatedly throughout the day.  Whenever you are outside, protect yourself by wearing Tritschler sleeves, pants, a wide-brimmed hat, and sunglasses.  What should I know about osteoporosis? Osteoporosis is a condition in which bone destruction happens more quickly than new bone creation. After menopause, you may be at an increased risk for osteoporosis. To help prevent osteoporosis or the bone fractures that can happen because of osteoporosis, the following is recommended:  If you are 53-7 years old, get at least 1,000 mg of calcium and at least 600 mg of vitamin D per day.  If you are older than age 1 but younger than age 77, get at least 1,200 mg of calcium and at least 600 mg of vitamin D per day.  If you are older than age 13, get at least 1,200 mg of calcium and at least 800 mg of vitamin D per day.  Smoking and excessive alcohol intake increase the risk of osteoporosis. Eat foods that are rich in calcium and vitamin D, and do weight-bearing exercises several times each week as directed by your health care provider. What should I know about how menopause affects my mental health? Depression may occur at any age, but it is more common as you become older. Common symptoms of depression  include:  Low or sad mood.  Changes in sleep patterns.  Changes in appetite or eating patterns.  Feeling an overall lack of motivation or enjoyment of activities that you previously enjoyed.  Frequent crying spells.  Talk with your health care provider if you think that you are experiencing depression. What should I know about immunizations? It is important that you get and maintain your immunizations. These include:  Tetanus, diphtheria, and pertussis (Tdap) booster vaccine.  Influenza every year before the flu season begins.  Pneumonia vaccine.  Shingles vaccine.  Your health care provider may also recommend other immunizations. This information is not intended to replace advice given to you by your health care provider. Make sure you discuss any questions you have with your health care provider. Document Released: 04/11/2005  Document Revised: 09/07/2015 Document Reviewed: 11/21/2014 Elsevier Interactive Patient Education  [employer].

## 2017-06-10 NOTE — Progress Notes (Signed)
Sylvia Reynolds November 14, 1950 [id number]    History:    Presents for annual exam.  Postmenopausal on no HRT with no bleeding.   Normal Pap and mammogram history.  2016- colonoscopy.  2017 normal DEXA primary care.  Has had Pneumovax but not Shingrex on methotrexate for rheumatoid arthritis.  Not sexually active husband's health prostate cancer. Primary care manages hypertension, and osteopenia.  Had lost approximately 20 pounds with weight watchers years ago and has  maintained.   Past medical history, past surgical history, family history and social history were all reviewed and documented in the EPIC chart.  Retired Pharmacist, hospital.  Has 65 year old twins one daughter is a PA, Benjamine Mola cerebral palsy in a wheelchair lives in assisted living apartment.  ROS:  A ROS was performed and pertinent positives and negatives are included.  Exam:  Vitals:   06/10/17 1011  BP: 134/80  Weight: 120 lb (54.4 kg)  Height: 4' 11.3" (1.506 m)   Body mass index is 23.99 kg/m.   General appearance:  Normal Thyroid:  Symmetrical, normal in size, without palpable masses or nodularity. Respiratory  Auscultation:  Clear without wheezing or rhonchi Cardiovascular  Auscultation:  Regular rate, without rubs, murmurs or gallops  Edema/varicosities:  Not grossly evident Abdominal  Soft,nontender, without masses, guarding or rebound.  Liver/spleen:  No organomegaly noted  Hernia:  None appreciated  Skin  Inspection:  Grossly normal   Breasts: Examined lying and sitting.     Right: Without masses, retractions, discharge or axillary adenopathy.     Left: Without masses, retractions, discharge or axillary adenopathy. Gentitourinary   Inguinal/mons:  Normal without inguinal adenopathy  External genitalia:  Normal  BUS/Urethra/Skene's glands:  Normal  Vagina:  Normal  Cervix:  Normal  Uterus:   normal in size, shape and contour.  Midline and mobile  Adnexa/parametria:     Rt: Without masses or  tenderness.   Lt: Without masses or tenderness.  Anus and perineum: Normal  Digital rectal exam: Normal sphincter tone without palpated masses or tenderness  Assessment/Plan:  67 y.o. MWF G1P2 for annual exam with no GYN complaints.  Postmenopausal on no HRT with no bleeding Hypertension/high osteopenia-primary care manages labs and meds Scoliosis Rheumatoid arthritis on methotrexate rheumatologist manages  Plan: Home safety, fall prevention and importance of weightbearing and balance type exercise encouraged.  Vitamin  D 2000 daily encouraged.  SBE's, continue annual screening mammogram, 3D tomography encouraged history of dense breasts.  Normal Pap 2017, new screening guidelines reviewed.   Huel Cote [facility], 1:32 PM 06/10/2017

## 2017-06-17 ENCOUNTER — Ambulatory Visit: Payer: BC Managed Care – PPO

## 2017-07-14 ENCOUNTER — Ambulatory Visit
Admission: RE | Admit: 2017-07-14 | Discharge: 2017-07-14 | Disposition: A | Discharge status: Home / Self Care | Payer: BC Managed Care – PPO | Source: Ambulatory Visit | Attending: Internal Medicine | Admitting: Internal Medicine

## 2017-07-14 DIAGNOSIS — Z1231 Encounter for screening mammogram for malignant neoplasm of breast: Secondary | ICD-10-CM

## 2017-07-15 ENCOUNTER — Encounter (INDEPENDENT_AMBULATORY_CARE_PROVIDER_SITE_OTHER): Payer: Self-pay

## 2018-05-31 ENCOUNTER — Other Ambulatory Visit: Payer: Self-pay | Admitting: Women's Health

## 2018-05-31 DIAGNOSIS — Z1231 Encounter for screening mammogram for malignant neoplasm of breast: Secondary | ICD-10-CM

## 2018-06-11 DIAGNOSIS — S52023A Displaced fracture of olecranon process without intraarticular extension of unspecified ulna, initial encounter for closed fracture: Secondary | ICD-10-CM | POA: Insufficient documentation

## 2018-06-15 ENCOUNTER — Encounter: Payer: BC Managed Care – PPO | Admitting: Women's Health

## 2018-06-29 ENCOUNTER — Ambulatory Visit: Payer: Self-pay | Admitting: Physician Assistant

## 2018-06-29 NOTE — H&P (Signed)
Sylvia Reynolds is an 68 y.o. female.   Chief Complaint: right elbow pain HPI: pt fell while walking on 06/21/18 tripped on some loose slippery leaves falling forward directly onto right arm. Developed abrasions and bruising over elbow pain persisted evaluated by pcp on 06/25/18 and xrays which were ordered revealed olecranon fracture.    Past Medical History:  Diagnosis Date  . Arthritis   . Hypertension   . Osteopenia   . Rheumatoid arthritis ([place])   . Scoliosis   . Shingles     Past Surgical History:  Procedure Laterality Date  . CESAREAN SECTION      Family History  Problem Relation Age of Onset  . Hypertension Mother   . Parkinson's disease Mother   . Cancer Father        prostate, chronic lymph leukemia  . Heart failure Father   . Hypertension Father   . Hypertension Sister   . Cancer Brother        CLL  . Kidney Stones Brother   . Thyroid disease Maternal Grandfather   . Breast cancer Maternal Aunt   . Breast cancer Maternal Aunt   . Breast cancer Cousin   . Breast cancer Maternal Aunt    Social History:  reports that she has never smoked. She has never used smokeless tobacco. She reports current alcohol use. She reports that she does not use drugs.  Allergies:  Allergies  Allergen Reactions  . Zithromax [Azithromycin] Diarrhea  . Ceftin [Cefuroxime Axetil] Nausea Only    (Not in a hospital admission)   No results found for this or any previous visit (from the past 48 hour(s)). No results found.  Review of Systems  Musculoskeletal: Positive for falls, joint pain and myalgias.  All other systems reviewed and are negative.   There were no vitals taken for this visit. Physical Exam  Constitutional: She is oriented to person, place, and time. She appears well-developed and well-nourished. No distress.  HENT:  Head: Normocephalic and atraumatic.  Nose: Nose normal.  Eyes: Pupils are equal, round, and reactive to light. Conjunctivae are normal.  Neck: Normal  range of motion. Neck supple.  Cardiovascular: Normal rate and intact distal pulses.  Respiratory: Effort normal. No respiratory distress.  GI: Soft. She exhibits no distension. There is no abdominal tenderness.  Musculoskeletal:     Right elbow: She exhibits decreased range of motion and swelling. Tenderness found. Olecranon process tenderness noted.  Neurological: She is alert and oriented to person, place, and time.  Skin: Skin is warm and dry. No rash noted. No erythema.  Psychiatric: She has a normal mood and affect. Her behavior is normal.     Assessment/Plan Right olecranon fracture  Discussed risks and benefits of ORIF right elbow and patient wishes to proceed.  Will be set up as an urgent surgery to be performed by dr's varkey and caffrey asap.    Chriss Czar, PA-C 06/29/2018, 7:23 PM

## 2018-06-29 NOTE — H&P (View-Only) (Signed)
Sylvia Reynolds is an 68 y.o. female.   Chief Complaint: right elbow pain HPI: pt fell while walking on 06/21/18 tripped on some loose slippery leaves falling forward directly onto right arm. Developed abrasions and bruising over elbow pain persisted evaluated by pcp on 06/25/18 and xrays which were ordered revealed olecranon fracture.    Past Medical History:  Diagnosis Date  . Arthritis   . Hypertension   . Osteopenia   . Rheumatoid arthritis ([place])   . Scoliosis   . Shingles     Past Surgical History:  Procedure Laterality Date  . CESAREAN SECTION      Family History  Problem Relation Age of Onset  . Hypertension Mother   . Parkinson's disease Mother   . Cancer Father        prostate, chronic lymph leukemia  . Heart failure Father   . Hypertension Father   . Hypertension Sister   . Cancer Brother        CLL  . Kidney Stones Brother   . Thyroid disease Maternal Grandfather   . Breast cancer Maternal Aunt   . Breast cancer Maternal Aunt   . Breast cancer Cousin   . Breast cancer Maternal Aunt    Social History:  reports that she has never smoked. She has never used smokeless tobacco. She reports current alcohol use. She reports that she does not use drugs.  Allergies:  Allergies  Allergen Reactions  . Zithromax [Azithromycin] Diarrhea  . Ceftin [Cefuroxime Axetil] Nausea Only    (Not in a hospital admission)   No results found for this or any previous visit (from the past 48 hour(s)). No results found.  Review of Systems  Musculoskeletal: Positive for falls, joint pain and myalgias.  All other systems reviewed and are negative.   There were no vitals taken for this visit. Physical Exam  Constitutional: She is oriented to person, place, and time. She appears well-developed and well-nourished. No distress.  HENT:  Head: Normocephalic and atraumatic.  Nose: Nose normal.  Eyes: Pupils are equal, round, and reactive to light. Conjunctivae are normal.  Neck: Normal  range of motion. Neck supple.  Cardiovascular: Normal rate and intact distal pulses.  Respiratory: Effort normal. No respiratory distress.  GI: Soft. She exhibits no distension. There is no abdominal tenderness.  Musculoskeletal:     Right elbow: She exhibits decreased range of motion and swelling. Tenderness found. Olecranon process tenderness noted.  Neurological: She is alert and oriented to person, place, and time.  Skin: Skin is warm and dry. No rash noted. No erythema.  Psychiatric: She has a normal mood and affect. Her behavior is normal.     Assessment/Plan Right olecranon fracture  Discussed risks and benefits of ORIF right elbow and patient wishes to proceed.  Will be set up as an urgent surgery to be performed by dr's varkey and caffrey asap.    Chriss Czar, PA-C 06/29/2018, 7:23 PM

## 2018-06-30 ENCOUNTER — Encounter (HOSPITAL_BASED_OUTPATIENT_CLINIC_OR_DEPARTMENT_OTHER): Payer: Self-pay | Admitting: *Deleted

## 2018-06-30 ENCOUNTER — Encounter (HOSPITAL_BASED_OUTPATIENT_CLINIC_OR_DEPARTMENT_OTHER)
Admission: RE | Admit: 2018-06-30 | Discharge: 2018-06-30 | Disposition: A | Discharge status: Home / Self Care | Payer: Medicare Other | Source: Ambulatory Visit | Attending: Orthopedic Surgery | Admitting: Orthopedic Surgery

## 2018-06-30 ENCOUNTER — Other Ambulatory Visit: Payer: Self-pay

## 2018-06-30 DIAGNOSIS — Z881 Allergy status to other antibiotic agents status: Secondary | ICD-10-CM | POA: Diagnosis not present

## 2018-06-30 DIAGNOSIS — I10 Essential (primary) hypertension: Secondary | ICD-10-CM | POA: Diagnosis not present

## 2018-06-30 DIAGNOSIS — M8580 Other specified disorders of bone density and structure, unspecified site: Secondary | ICD-10-CM | POA: Diagnosis not present

## 2018-06-30 DIAGNOSIS — S42401A Unspecified fracture of lower end of right humerus, initial encounter for closed fracture: Secondary | ICD-10-CM | POA: Diagnosis present

## 2018-06-30 DIAGNOSIS — M069 Rheumatoid arthritis, unspecified: Secondary | ICD-10-CM | POA: Diagnosis not present

## 2018-06-30 DIAGNOSIS — W010XXA Fall on same level from slipping, tripping and stumbling without subsequent striking against object, initial encounter: Secondary | ICD-10-CM | POA: Diagnosis not present

## 2018-06-30 DIAGNOSIS — M1990 Unspecified osteoarthritis, unspecified site: Secondary | ICD-10-CM | POA: Diagnosis not present

## 2018-06-30 DIAGNOSIS — S52021A Displaced fracture of olecranon process without intraarticular extension of right ulna, initial encounter for closed fracture: Secondary | ICD-10-CM | POA: Diagnosis not present

## 2018-06-30 DIAGNOSIS — Y9301 Activity, walking, marching and hiking: Secondary | ICD-10-CM | POA: Diagnosis not present

## 2018-06-30 LAB — CBC WITH DIFFERENTIAL/PLATELET
Abs Immature Granulocytes: 0.01 10*3/uL (ref 0.00–0.07)
Basophils Absolute: 0 10*3/uL (ref 0.0–0.1)
Basophils Relative: 0 %
Eosinophils Absolute: 0.1 10*3/uL (ref 0.0–0.5)
Eosinophils Relative: 1 %
HCT: 36.4 % (ref 36.0–46.0)
Hemoglobin: 12.5 g/dL (ref 12.0–15.0)
Immature Granulocytes: 0 %
Lymphocytes Relative: 9 %
Lymphs Abs: 0.7 10*3/uL (ref 0.7–4.0)
MCH: 33.8 pg (ref 26.0–34.0)
MCHC: 34.3 g/dL (ref 30.0–36.0)
MCV: 98.4 fL (ref 80.0–100.0)
Monocytes Absolute: 0.7 10*3/uL (ref 0.1–1.0)
Monocytes Relative: 9 %
Neutro Abs: 6.2 10*3/uL (ref 1.7–7.7)
Neutrophils Relative %: 81 %
Platelets: 222 10*3/uL (ref 150–400)
RBC: 3.7 MIL/uL — ABNORMAL LOW (ref 3.87–5.11)
RDW: 13.2 % (ref 11.5–15.5)
WBC: 7.8 10*3/uL (ref 4.0–10.5)
nRBC: 0 % (ref 0.0–0.2)

## 2018-06-30 LAB — COMPREHENSIVE METABOLIC PANEL
ALT: 26 U/L (ref 0–44)
AST: 27 U/L (ref 15–41)
Albumin: 4 g/dL (ref 3.5–5.0)
Alkaline Phosphatase: 43 U/L (ref 38–126)
Anion gap: 9 (ref 5–15)
BUN: 18 mg/dL (ref 8–23)
CO2: 27 mmol/L (ref 22–32)
Calcium: 9.5 mg/dL (ref 8.9–10.3)
Chloride: 103 mmol/L (ref 98–111)
Creatinine, Ser: 0.78 mg/dL (ref 0.44–1.00)
GFR calc Af Amer: >60 mL/min (ref >60)
GFR calc non Af Amer: >60 mL/min (ref >60)
Glucose, Bld: 80 mg/dL (ref 70–99)
Potassium: 4.4 mmol/L (ref 3.5–5.1)
Sodium: 139 mmol/L (ref 135–145)
Total Bilirubin: 0.6 mg/dL (ref 0.3–1.2)
Total Protein: 6.5 g/dL (ref 6.5–8.1)

## 2018-06-30 LAB — TYPE AND SCREEN
ABO/RH(D): O POS
Antibody Screen: NEGATIVE

## 2018-06-30 LAB — ABO/RH: ABO/RH(D): O POS

## 2018-06-30 NOTE — Progress Notes (Signed)
EKG reviewed with Dr Ambrose Pancoast.  OK to proceed as planned

## 2018-07-01 ENCOUNTER — Ambulatory Visit (HOSPITAL_BASED_OUTPATIENT_CLINIC_OR_DEPARTMENT_OTHER)
Admission: RE | Admit: 2018-07-01 | Discharge: 2018-07-01 | Disposition: A | Discharge status: Home / Self Care | Payer: Medicare Other | Attending: Orthopedic Surgery | Admitting: Orthopedic Surgery

## 2018-07-01 ENCOUNTER — Ambulatory Visit (HOSPITAL_BASED_OUTPATIENT_CLINIC_OR_DEPARTMENT_OTHER): Payer: Medicare Other | Admitting: Anesthesiology

## 2018-07-01 ENCOUNTER — Encounter (HOSPITAL_BASED_OUTPATIENT_CLINIC_OR_DEPARTMENT_OTHER)
Admission: RE | Disposition: A | Discharge status: Home / Self Care | Payer: Self-pay | Source: Home / Self Care | Attending: Orthopedic Surgery

## 2018-07-01 ENCOUNTER — Encounter (HOSPITAL_BASED_OUTPATIENT_CLINIC_OR_DEPARTMENT_OTHER): Payer: Self-pay

## 2018-07-01 ENCOUNTER — Other Ambulatory Visit: Payer: Self-pay

## 2018-07-01 DIAGNOSIS — M1990 Unspecified osteoarthritis, unspecified site: Secondary | ICD-10-CM | POA: Insufficient documentation

## 2018-07-01 DIAGNOSIS — M069 Rheumatoid arthritis, unspecified: Secondary | ICD-10-CM | POA: Diagnosis not present

## 2018-07-01 DIAGNOSIS — I10 Essential (primary) hypertension: Secondary | ICD-10-CM | POA: Diagnosis not present

## 2018-07-01 DIAGNOSIS — Z881 Allergy status to other antibiotic agents status: Secondary | ICD-10-CM | POA: Insufficient documentation

## 2018-07-01 DIAGNOSIS — M8580 Other specified disorders of bone density and structure, unspecified site: Secondary | ICD-10-CM | POA: Insufficient documentation

## 2018-07-01 DIAGNOSIS — S52021A Displaced fracture of olecranon process without intraarticular extension of right ulna, initial encounter for closed fracture: Secondary | ICD-10-CM | POA: Diagnosis not present

## 2018-07-01 DIAGNOSIS — W010XXA Fall on same level from slipping, tripping and stumbling without subsequent striking against object, initial encounter: Secondary | ICD-10-CM | POA: Insufficient documentation

## 2018-07-01 DIAGNOSIS — Y9301 Activity, walking, marching and hiking: Secondary | ICD-10-CM | POA: Insufficient documentation

## 2018-07-01 HISTORY — PX: ORIF ELBOW FRACTURE: SHX5031

## 2018-07-01 HISTORY — DX: Unspecified fracture of lower end of right humerus, initial encounter for closed fracture: S42.401A

## 2018-07-01 SURGERY — OPEN REDUCTION INTERNAL FIXATION (ORIF) ELBOW/OLECRANON FRACTURE
Anesthesia: Regional | Laterality: Right

## 2018-07-01 MED ORDER — PROPOFOL 500 MG/50ML IV EMUL
INTRAVENOUS | Status: AC
  Filled 2018-07-01: qty 50

## 2018-07-01 MED ORDER — SUCCINYLCHOLINE CHLORIDE 200 MG/10ML IV SOSY
PREFILLED_SYRINGE | INTRAVENOUS | Status: AC
  Filled 2018-07-01: qty 10

## 2018-07-01 MED ORDER — FENTANYL CITRATE (PF) 100 MCG/2ML IJ SOLN
INTRAMUSCULAR | Status: AC
  Filled 2018-07-01: qty 2

## 2018-07-01 MED ORDER — FENTANYL CITRATE (PF) 100 MCG/2ML IJ SOLN
50.0000 ug | INTRAMUSCULAR | Status: DC | PRN
  Administered 2018-07-01: 07:00:00 50 ug via INTRAVENOUS

## 2018-07-01 MED ORDER — 0.9 % SODIUM CHLORIDE (POUR BTL) OPTIME
TOPICAL | Status: DC | PRN
  Administered 2018-07-01: 1000 mL

## 2018-07-01 MED ORDER — ONDANSETRON HCL 4 MG/2ML IJ SOLN
INTRAMUSCULAR | Status: AC
  Filled 2018-07-01: qty 2

## 2018-07-01 MED ORDER — DEXAMETHASONE SODIUM PHOSPHATE 4 MG/ML IJ SOLN
INTRAMUSCULAR | Status: DC | PRN
  Administered 2018-07-01: 10 mg via INTRAVENOUS

## 2018-07-01 MED ORDER — PROPOFOL 10 MG/ML IV BOLUS
INTRAVENOUS | Status: DC | PRN
  Administered 2018-07-01: 120 mg via INTRAVENOUS

## 2018-07-01 MED ORDER — SUCCINYLCHOLINE CHLORIDE 20 MG/ML IJ SOLN
INTRAMUSCULAR | Status: DC | PRN
  Administered 2018-07-01: 60 mg via INTRAVENOUS

## 2018-07-01 MED ORDER — SODIUM CHLORIDE 0.9 % IV SOLN: INTRAVENOUS | Status: DC

## 2018-07-01 MED ORDER — MIDAZOLAM HCL 2 MG/2ML IJ SOLN
INTRAMUSCULAR | Status: AC
  Filled 2018-07-01: qty 2

## 2018-07-01 MED ORDER — LIDOCAINE 2% (20 MG/ML) 5 ML SYRINGE
INTRAMUSCULAR | Status: DC | PRN
  Administered 2018-07-01: 60 mg via INTRAVENOUS

## 2018-07-01 MED ORDER — LIDOCAINE 2% (20 MG/ML) 5 ML SYRINGE
INTRAMUSCULAR | Status: AC
  Filled 2018-07-01: qty 5

## 2018-07-01 MED ORDER — CLINDAMYCIN PHOSPHATE 900 MG/50ML IV SOLN
INTRAVENOUS | Status: AC
  Filled 2018-07-01: qty 50

## 2018-07-01 MED ORDER — MIDAZOLAM HCL 2 MG/2ML IJ SOLN
1.0000 mg | INTRAMUSCULAR | Status: DC | PRN
  Administered 2018-07-01: 07:00:00 1 mg via INTRAVENOUS

## 2018-07-01 MED ORDER — SCOPOLAMINE 1 MG/3DAYS TD PT72: 1.0000 | MEDICATED_PATCH | Freq: Once | TRANSDERMAL | Status: DC | PRN

## 2018-07-01 MED ORDER — EPHEDRINE SULFATE 50 MG/ML IJ SOLN
INTRAMUSCULAR | Status: DC | PRN
  Administered 2018-07-01 (×2): 10 mg via INTRAVENOUS

## 2018-07-01 MED ORDER — ONDANSETRON HCL 4 MG/2ML IJ SOLN
INTRAMUSCULAR | Status: DC | PRN
  Administered 2018-07-01: 4 mg via INTRAVENOUS

## 2018-07-01 MED ORDER — CLINDAMYCIN PHOSPHATE 900 MG/50ML IV SOLN
900.0000 mg | INTRAVENOUS | Status: AC
  Administered 2018-07-01: 900 mg via INTRAVENOUS

## 2018-07-01 MED ORDER — HYDROCODONE-ACETAMINOPHEN 5-325 MG PO TABS: ORAL_TABLET | ORAL | 0 refills | Status: DC

## 2018-07-01 MED ORDER — DEXAMETHASONE SODIUM PHOSPHATE 10 MG/ML IJ SOLN
INTRAMUSCULAR | Status: AC
  Filled 2018-07-01: qty 1

## 2018-07-01 MED ORDER — VANCOMYCIN HCL 1000 MG IV SOLR
INTRAVENOUS | Status: AC
  Filled 2018-07-01: qty 1000

## 2018-07-01 MED ORDER — LACTATED RINGERS IV SOLN
INTRAVENOUS | Status: DC
  Administered 2018-07-01 (×2): via INTRAVENOUS

## 2018-07-01 MED ORDER — ROPIVACAINE HCL 5 MG/ML IJ SOLN
INTRAMUSCULAR | Status: DC | PRN
  Administered 2018-07-01: 30 mL via PERINEURAL

## 2018-07-01 MED ORDER — CHLORHEXIDINE GLUCONATE 4 % EX LIQD: 60.0000 mL | Freq: Once | CUTANEOUS | Status: DC

## 2018-07-01 SURGICAL SUPPLY — 68 items
BANDAGE ACE 4X5 VEL STRL LF (GAUZE/BANDAGES/DRESSINGS) ×2 IMPLANT
BANDAGE ACE 6X5 VEL STRL LF (GAUZE/BANDAGES/DRESSINGS) ×2 IMPLANT
BENZOIN TINCTURE PRP APPL 2/3 (GAUZE/BANDAGES/DRESSINGS) ×2 IMPLANT
BIT DRILL CANNULATED (DRILL) ×1 IMPLANT
BLADE HEX COATED 2.75 (ELECTRODE) ×2 IMPLANT
BLADE SURG 10 STRL SS (BLADE) IMPLANT
BLADE SURG 15 STRL LF DISP TIS (BLADE) ×1 IMPLANT
BLADE SURG 15 STRL SS (BLADE) ×1
BNDG ESMARK 4X9 LF (GAUZE/BANDAGES/DRESSINGS) ×2 IMPLANT
CHLORAPREP W/TINT 26 (MISCELLANEOUS) ×2 IMPLANT
COVER WAND RF STERILE (DRAPES) ×2 IMPLANT
CUFF TOURN SGL QUICK 18X3 (MISCELLANEOUS) ×2 IMPLANT
CUFF TOURN SGL QUICK 24 (TOURNIQUET CUFF)
CUFF TRNQT CYL 24X4X16.5-23 (TOURNIQUET CUFF) IMPLANT
DRAPE EXTREMITY T 121X128X90 (DISPOSABLE) ×2 IMPLANT
DRAPE INCISE IOBAN 66X45 STRL (DRAPES) ×2 IMPLANT
DRAPE OEC MINIVIEW 54X84 (DRAPES) ×2 IMPLANT
DRAPE U-SHAPE 47X51 STRL (DRAPES) ×2 IMPLANT
DRILL CANNULATED (DRILL) ×2
DRSG EMULSION OIL 3X3 NADH (GAUZE/BANDAGES/DRESSINGS) IMPLANT
DRSG PAD ABDOMINAL 8X10 ST (GAUZE/BANDAGES/DRESSINGS) ×2 IMPLANT
ELECT REM PT RETURN 9FT ADLT (ELECTROSURGICAL) ×2
ELECTRODE REM PT RTRN 9FT ADLT (ELECTROSURGICAL) ×1 IMPLANT
GAUZE SPONGE 4X4 12PLY STRL (GAUZE/BANDAGES/DRESSINGS) ×2 IMPLANT
GAUZE XEROFORM 1X8 LF (GAUZE/BANDAGES/DRESSINGS) IMPLANT
GLOVE BIO SURGEON STRL SZ7.5 (GLOVE) ×2 IMPLANT
GLOVE BIOGEL PI IND STRL 8 (GLOVE) ×3 IMPLANT
GLOVE BIOGEL PI INDICATOR 8 (GLOVE) ×3
GLOVE ECLIPSE 8.0 STRL XLNG CF (GLOVE) ×2 IMPLANT
GLOVE SURG ORTHO 8.0 STRL STRW (GLOVE) ×2 IMPLANT
GOWN STRL REUS W/ TWL LRG LVL3 (GOWN DISPOSABLE) ×1 IMPLANT
GOWN STRL REUS W/TWL LRG LVL3 (GOWN DISPOSABLE) ×1
GOWN STRL REUS W/TWL XL LVL3 (GOWN DISPOSABLE) ×4 IMPLANT
NS IRRIG 1000ML POUR BTL (IV SOLUTION) ×2 IMPLANT
PACK ARTHROSCOPY DSU (CUSTOM PROCEDURE TRAY) ×2 IMPLANT
PACK BASIN DAY SURGERY FS (CUSTOM PROCEDURE TRAY) ×2 IMPLANT
PAD CAST 4YDX4 CTTN HI CHSV (CAST SUPPLIES) ×1 IMPLANT
PADDING CAST COTTON 4X4 STRL (CAST SUPPLIES) ×1
PENCIL BUTTON HOLSTER BLD 10FT (ELECTRODE) ×2 IMPLANT
RETRIEVER SUT HEWSON (MISCELLANEOUS) ×2 IMPLANT
SCREW CANN THRD SD 8.1/6.5X95 (Screw) ×2 IMPLANT
SHEET MEDIUM DRAPE 40X70 STRL (DRAPES) ×2 IMPLANT
SLEEVE SCD COMPRESS KNEE MED (MISCELLANEOUS) ×2 IMPLANT
SLING ARM FOAM STRAP LRG (SOFTGOODS) ×2 IMPLANT
SLING ARM IMMOBILIZER LRG (SOFTGOODS) IMPLANT
SPLINT FAST PLASTER 5X30 (CAST SUPPLIES)
SPLINT FIBERGLASS 4X30 (CAST SUPPLIES) ×2 IMPLANT
SPLINT PLASTER CAST FAST 5X30 (CAST SUPPLIES) IMPLANT
SPONGE LAP 18X18 RF (DISPOSABLE) ×2 IMPLANT
STAPLER VISISTAT 35W (STAPLE) ×2 IMPLANT
STRIP CLOSURE SKIN 1/2X4 (GAUZE/BANDAGES/DRESSINGS) ×2 IMPLANT
SUCTION FRAZIER HANDLE 10FR (MISCELLANEOUS) ×1
SUCTION TUBE FRAZIER 10FR DISP (MISCELLANEOUS) ×1 IMPLANT
SUT BROADBAND TAPE 2PK 1.5 (SUTURE) ×2 IMPLANT
SUT MNCRL AB 3-0 PS2 27 (SUTURE) ×2 IMPLANT
SUT VIC AB 0 CT1 27 (SUTURE) ×1
SUT VIC AB 0 CT1 27XBRD ANBCTR (SUTURE) ×1 IMPLANT
SUT VIC AB 1 CT1 27 (SUTURE)
SUT VIC AB 1 CT1 27XBRD ANBCTR (SUTURE) IMPLANT
SUT VIC AB 2-0 SH 27 (SUTURE) ×1
SUT VIC AB 2-0 SH 27XBRD (SUTURE) ×1 IMPLANT
SUT VIC AB 3-0 SH 27 (SUTURE)
SUT VIC AB 3-0 SH 27X BRD (SUTURE) IMPLANT
SYR BULB IRRIGATION 50ML (SYRINGE) ×2 IMPLANT
TAP CANN QC 6.5 (BIT) ×2 IMPLANT
TOWEL GREEN STERILE FF (TOWEL DISPOSABLE) ×2 IMPLANT
WASHER 5.5MM STAINLESS STEEL (Washer) ×2 IMPLANT
YANKAUER SUCT BULB TIP NO VENT (SUCTIONS) ×2 IMPLANT

## 2018-07-01 NOTE — Anesthesia Procedure Notes (Signed)
Anesthesia Regional Block: Supraclavicular block   Pre-Anesthetic Checklist: ,, timeout performed, Correct Patient, Correct Site, Correct Laterality, Correct Procedure, Correct Position, site marked, Risks and benefits discussed,  Surgical consent,  Pre-op evaluation,  At surgeon's request and post-op pain management  Laterality: Right  Prep: chloraprep       Needles:  Injection technique: Single-shot  Needle Type: Echogenic Stimulator Needle     Needle Length: 9cm  Needle Gauge: 21     Additional Needles:   Procedures:,,,, ultrasound used (permanent image in chart),,,,  Narrative:  Start time: 07/01/2018 7:09 AM End time: 07/01/2018 7:12 AM Injection made incrementally with aspirations every 5 mL.  Performed by: Personally  Anesthesiologist: Lidia Collum, MD  Additional Notes: Monitors applied. Injection made in 5cc increments. No resistance to injection. Good needle visualization. Patient tolerated procedure well.

## 2018-07-01 NOTE — Transfer of Care (Signed)
Immediate Anesthesia Transfer of Care Note  Patient: Sylvia Reynolds  Procedure(s) Performed: OPEN REDUCTION INTERNAL FIXATION (ORIF) RIGHT ELBOW/OLECRANON FRACTURE (Right )  Patient Location: PACU  Anesthesia Type:GA combined with regional for post-op pain  Level of Consciousness: sedated  Airway & Oxygen Therapy: Patient Spontanous Breathing and Patient connected to nasal cannula oxygen  Post-op Assessment: Report given to RN and Post -op Vital signs reviewed and stable  Post vital signs: Reviewed and stable  Last Vitals:  Vitals Value Taken Time  BP    Temp    Pulse 66 07/01/2018  9:18 AM  Resp 17 07/01/2018  9:18 AM  SpO2 100 % 07/01/2018  9:18 AM  Vitals shown include unvalidated device data.  Last Pain:  Vitals:   07/01/18 0637  TempSrc: Oral  PainSc: 1          Complications: No apparent anesthesia complications

## 2018-07-01 NOTE — Op Note (Signed)
NAME: Crabbe, Mikaili M. MEDICAL RECORD [id number] ACCOUNT [id number] DATE OF BIRTH:Oct 31, 1950 FACILITY: [facility] LOCATION: [facility] PHYSICIAN:W. Lashannon Bresnan JR., MD  OPERATIVE REPORT  DATE OF PROCEDURE:  07/01/2018  PREOPERATIVE DIAGNOSIS:  Displaced right olecranon fracture.  POSTOPERATIVE DIAGNOSIS:  Displaced right olecranon fracture.  OPERATION:  Open reduction internal fixation of right olecranon fracture.  SURGEON:  Vangie Bicker, MD  ASSISTANT:  Doran Durand.  TOURNIQUET TIME:  Approximately 50 minutes.  DESCRIPTION OF PROCEDURE:  Lateral position on beanbag with a shear foot for positioning of the elbow.  We made a longitudinal incision after the tourniquet was inflated and exsanguination to 250 over the ____.  Did a triceps splitting approach,  identifying the fragment.  The fragment was large enough to fix with a screw, but not large enough for a plate.  The patient's fracture was approximately 75-1/2 to 36 weeks old.  We removed early callus and clot, did a provisional reduction with a clamp.   Followed this by placing the K-wire for the Biomet 6.5 cannulated screw.  Confirmed position of the K-wire on the AP and lateral plane.  We then drilled a small transverse hole distal to the fracture in the proximal shaft of the ulna using a [place]  ligament passer to pass a Biomet FiberTape through the transverse hole to later form a tension band.  We then tapped with the tap for the 6.5 screw, visualized the tap position as well, filling the canal nicely in both planes with good central  positioning of the screw, which was noted to be proximal to the curvature of the ulna.  We then overdrilled the proximal cortex of the fragment just into the distal before placing the 6.5 screw with a washer, again confirming position in AP and lateral  plane with good positioning of the fracture.  Prior to that, we placed the FiberTape in a tension band type fashion around the screw to create a tension  band effect with the FiberTape.  Again, anatomic reduction was confirmed in both planes.  Triceps  split was closed with Vicryl and the subcutaneous tissues with Vicryl and the skin with a Monocryl.  A light compressive sterile dressing and posterior splint applied.  Tourniquet was released after application of the dressing.  AN/NUANCE  D:07/01/2018 T:07/01/2018 JOB:[id number]

## 2018-07-01 NOTE — Progress Notes (Signed)
Assisted Dr. Witman with right, ultrasound guided, supraclavicular block. Side rails up, monitors on throughout procedure. See vital signs in flow sheet. Tolerated Procedure well. 

## 2018-07-01 NOTE — Discharge Instructions (Signed)
Diet: As you were doing prior to hospitalization   Activity: Increase activity slowly as tolerated  No lifting or driving for 6 weeks   Shower: must keep splint dry until follow up   Dressing/slpint: leave splint in place until follow up keeping clean and dry   Weight Bearing: nonweight bearing operative arm  To prevent constipation: you may use a stool softener such as -  Colace ( over the counter) 100 mg by mouth twice a day  Drink plenty of fluids ( prune juice may be helpful) and high fiber foods  Miralax ( over the counter) for constipation as needed.   Precautions: If you experience chest pain or shortness of breath - call 911 immediately For transfer to the hospital emergency department!!  If you develop a fever greater that 101 F, purulent drainage from wound, increased redness or drainage from wound, or calf pain -- Call the office   Follow- Up Appointment: Please call for an appointment to be seen in 1 week  [place] - [phone]  Post Anesthesia Home Care Instructions  Activity: Get plenty of rest for the remainder of the day. A responsible individual must stay with you for 24 hours following the procedure.  For the next 24 hours, DO NOT: -Drive a car -Paediatric nurse -Drink alcoholic beverages -Take any medication unless instructed by your physician -Make any legal decisions or sign important papers.  Meals: Start with liquid foods such as gelatin or soup. Progress to regular foods as tolerated. Avoid greasy, spicy, heavy foods. If nausea and/or vomiting occur, drink only clear liquids until the nausea and/or vomiting subsides. Call your physician if vomiting continues.  Special Instructions/Symptoms: Your throat may feel dry or sore from the anesthesia or the breathing tube placed in your throat during surgery. If this causes discomfort, gargle with warm salt water. The discomfort should disappear within 24 hours.  If you had a scopolamine patch placed  behind your ear for the management of post- operative nausea and/or vomiting:  1. The medication in the patch is effective for 72 hours, after which it should be removed.  Wrap patch in a tissue and discard in the trash. Wash hands thoroughly with soap and water. 2. You may remove the patch earlier than 72 hours if you experience unpleasant side effects which may include dry mouth, dizziness or visual disturbances. 3. Avoid touching the patch. Wash your hands with soap and water after contact with the patch.    Regional Anesthesia Blocks  1. Numbness or the inability to move the "blocked" extremity may last from 3-48 hours after placement. The length of time depends on the medication injected and your individual response to the medication. If the numbness is not going away after 48 hours, call your surgeon.  2. The extremity that is blocked will need to be protected until the numbness is gone and the  Strength has returned. Because you cannot feel it, you will need to take extra care to avoid injury. Because it may be weak, you may have difficulty moving it or using it. You may not know what position it is in without looking at it while the block is in effect.  3. For blocks in the legs and feet, returning to weight bearing and walking needs to be done carefully. You will need to wait until the numbness is entirely gone and the strength has returned. You should be able to move your leg and foot normally before you try and bear weight or walk.  You will need someone to be with you when you first try to ensure you do not fall and possibly risk injury.  4. Bruising and tenderness at the needle site are common side effects and will resolve in a few days.  5. Persistent numbness or new problems with movement should be communicated to the surgeon or the [place] [phone] [place] [phone]).

## 2018-07-01 NOTE — Interval H&P Note (Signed)
History and Physical Interval Note:  07/01/2018 7:28 AM  Sylvia Reynolds  has presented today for surgery, with the diagnosis of RIGHT ELBOW FRACTURE.  The various methods of treatment have been discussed with the patient and family. After consideration of risks, benefits and other options for treatment, the patient has consented to  Procedure(s): OPEN REDUCTION INTERNAL FIXATION (ORIF) RIGHT ELBOW/OLECRANON FRACTURE (Right) as a surgical intervention.  The patient's history has been reviewed, patient examined, no change in status, stable for surgery.  I have reviewed the patient's chart and labs.  Questions were answered to the patient's satisfaction.     Yvette Rack

## 2018-07-01 NOTE — Anesthesia Postprocedure Evaluation (Signed)
Anesthesia Post Note  Patient: Sylvia Reynolds  Procedure(s) Performed: OPEN REDUCTION INTERNAL FIXATION (ORIF) RIGHT ELBOW/OLECRANON FRACTURE (Right )     Patient location during evaluation: PACU Anesthesia Type: General Level of consciousness: awake and alert Pain management: pain level controlled Vital Signs Assessment: post-procedure vital signs reviewed and stable Respiratory status: spontaneous breathing, nonlabored ventilation and respiratory function stable Cardiovascular status: blood pressure returned to baseline and stable Postop Assessment: no apparent nausea or vomiting Anesthetic complications: no    Last Vitals:  Vitals:   07/01/18 0945 07/01/18 1000  BP: (!) 150/90 (!) 142/90  Pulse: 70 67  Resp: 17 12  Temp:    SpO2: 97% 95%    Last Pain:  Vitals:   07/01/18 1000  TempSrc:   PainSc: 0-No pain                 Lidia Collum

## 2018-07-01 NOTE — Anesthesia Procedure Notes (Signed)
Procedure Name: Intubation Date/Time: 07/01/2018 7:44 AM Performed by: Maryella Shivers, CRNA Pre-anesthesia Checklist: Patient identified, Emergency Drugs available, Suction available and Patient being monitored Patient Re-evaluated:Patient Re-evaluated prior to induction Oxygen Delivery Method: Circle system utilized Preoxygenation: Pre-oxygenation with 100% oxygen Induction Type: IV induction Ventilation: Mask ventilation without difficulty Laryngoscope Size: Mac and 3 Grade View: Grade I Tube type: Oral Tube size: 7.0 mm Number of attempts: 1 Airway Equipment and Method: Stylet and Oral airway Placement Confirmation: ETT inserted through vocal cords under direct vision,  positive ETCO2 and breath sounds checked- equal and bilateral Secured at: 20 cm Tube secured with: Tape Dental Injury: Teeth and Oropharynx as per pre-operative assessment

## 2018-07-01 NOTE — Anesthesia Preprocedure Evaluation (Addendum)
Anesthesia Evaluation  Patient identified by MRN, date of birth, ID band Patient awake    Reviewed: Allergy & Precautions, NPO status , Patient's Chart, lab work & pertinent test results  History of Anesthesia Complications Negative for: history of anesthetic complications  Airway Mallampati: II  TM Distance: >3 FB Neck ROM: Full    Dental no notable dental hx.    Pulmonary neg pulmonary ROS,    Pulmonary exam normal        Cardiovascular hypertension, Normal cardiovascular exam     Neuro/Psych negative neurological ROS  negative psych ROS   GI/Hepatic negative GI ROS, Neg liver ROS,   Endo/Other  negative endocrine ROS  Renal/GU negative Renal ROS  negative genitourinary   Musculoskeletal  (+) Arthritis , Rheumatoid disorders,    Abdominal   Peds  Hematology negative hematology ROS (+)   Anesthesia Other Findings   Reproductive/Obstetrics                             Anesthesia Physical Anesthesia Plan  ASA: II  Anesthesia Plan: General   Post-op Pain Management: GA combined w/ Regional for post-op pain   Induction: Intravenous and Rapid sequence  PONV Risk Score and Plan: 3 and Treatment may vary due to age or medical condition, Ondansetron, Dexamethasone and Midazolam  Airway Management Planned: Oral ETT  Additional Equipment: None  Intra-op Plan:   Post-operative Plan: Extubation in OR  Informed Consent: I have reviewed the patients History and Physical, chart, labs and discussed the procedure including the risks, benefits and alternatives for the proposed anesthesia with the patient or authorized representative who has indicated his/her understanding and acceptance.       Plan Discussed with:   Anesthesia Plan Comments:        Anesthesia Quick Evaluation

## 2018-07-01 NOTE — Brief Op Note (Signed)
07/01/2018  9:11 AM  PATIENT:  Sylvia Reynolds  68 y.o. female  PRE-OPERATIVE DIAGNOSIS:  RIGHT ELBOW FRACTURE  POST-OPERATIVE DIAGNOSIS:  RIGHT ELBOW FRACTURE  PROCEDURE:  Procedure(s): OPEN REDUCTION INTERNAL FIXATION (ORIF) RIGHT ELBOW/OLECRANON FRACTURE (Right)  SURGEON:  Surgeon(s) and Role:    * Earlie Server, MD - Primary    * Hiram Gash, MD - Assisting  PHYSICIAN ASSISTANT: Chriss Czar, PA-C  ASSISTANTS:    ANESTHESIA:   general  EBL:  5 mL   BLOOD ADMINISTERED:none  DRAINS: none   LOCAL MEDICATIONS USED:  NONE  SPECIMEN:  No Specimen  DISPOSITION OF SPECIMEN:  N/A  COUNTS:  YES  TOURNIQUET:   Total Tourniquet Time Documented: Upper Arm (Right) - 57 minutes Total: Upper Arm (Right) - 57 minutes   DICTATION: .Other Dictation: Dictation Number unknown  PLAN OF CARE: Discharge to home after PACU  PATIENT DISPOSITION:  PACU - hemodynamically stable.   Delay start of Pharmacological VTE agent (>24hrs) due to surgical blood loss or risk of bleeding: not applicable

## 2018-07-02 ENCOUNTER — Encounter (HOSPITAL_BASED_OUTPATIENT_CLINIC_OR_DEPARTMENT_OTHER): Payer: Self-pay | Admitting: Orthopedic Surgery

## 2018-07-27 ENCOUNTER — Ambulatory Visit: Payer: BC Managed Care – PPO

## 2018-08-04 ENCOUNTER — Encounter: Payer: Self-pay | Admitting: Women's Health

## 2018-08-10 ENCOUNTER — Other Ambulatory Visit: Payer: Self-pay

## 2018-08-11 ENCOUNTER — Encounter: Payer: Self-pay | Admitting: Women's Health

## 2018-08-11 ENCOUNTER — Ambulatory Visit (INDEPENDENT_AMBULATORY_CARE_PROVIDER_SITE_OTHER): Payer: Medicare Other | Admitting: Women's Health

## 2018-08-11 VITALS — BP 118/78 | Ht 59.0 in | Wt 118.0 lb

## 2018-08-11 DIAGNOSIS — Z01419 Encounter for gynecological examination (general) (routine) without abnormal findings: Secondary | ICD-10-CM | POA: Diagnosis not present

## 2018-08-11 DIAGNOSIS — M810 Age-related osteoporosis without current pathological fracture: Secondary | ICD-10-CM

## 2018-08-11 DIAGNOSIS — I10 Essential (primary) hypertension: Secondary | ICD-10-CM | POA: Insufficient documentation

## 2018-08-11 DIAGNOSIS — M069 Rheumatoid arthritis, unspecified: Secondary | ICD-10-CM | POA: Insufficient documentation

## 2018-08-11 NOTE — Progress Notes (Signed)
Sylvia Reynolds 07-19-1950 [id number]    History:    Presents for breast and pelvic exam with no complaints.  Had a recent fall 06/2018 and fractured  right elbowhad surgery and is doing better, starting physical therapy.  Normal Pap and mammogram history 2016- colonoscopy.  2019 mild osteopenia primary care manages.  Hypertension, rheumatoid arthritis on methotrexate.  Has had Pneumovax but not Shingrix due to the methotrexate.  Not sexually active husband prostate cancer.  Past medical history, past surgical history, family history and social history were all reviewed and documented in the EPIC chart.  Retired Pharmacist, hospital.  Twins daughters, 1 is attorney lives in [place], other daughter wheelchair bound with cerebral palsy.    ROS:  A ROS was performed and pertinent positives and negatives are included.  Exam:  Vitals:   08/11/18 1230  BP: 118/78  Weight: 118 lb (53.5 kg)  Height: 4\' 11"  (1.499 m)   Body mass index is 23.83 kg/m.   General appearance:  Normal Thyroid:  Symmetrical, normal in size, without palpable masses or nodularity. Respiratory  Auscultation:  Clear without wheezing or rhonchi Cardiovascular  Auscultation:  Regular rate, without rubs, murmurs or gallops  Edema/varicosities:  Not grossly evident Abdominal  Soft,nontender, without masses, guarding or rebound.  Liver/spleen:  No organomegaly noted  Hernia:  None appreciated  Skin  Inspection:  Grossly normal   Breasts: Examined lying and sitting.     Right: Without masses, retractions, discharge or axillary adenopathy.     Left: Without masses, retractions, discharge or axillary adenopathy. Gentitourinary   Inguinal/mons:  Normal without inguinal adenopathy  External genitalia:  Normal  BUS/Urethra/Skene's glands:  Normal  Vagina: Mild atrophy cervix:  Normal  Uterus:   Adnexa/parametria:     Rt: Without masses or tenderness.   Lt: Without masses or tenderness.  Anus and perineum: Normal  Digital rectal  exam: Normal sphincter tone without palpated masses or tenderness  Assessment/Plan:  69 y.o. MWF G1 P2 for breast and pelvic exam with no complaints.  Postmenopausal/no HRT/no bleeding RA, hypertension-primary care manages labs and meds Osteopenia, recent right elbow fracture in physical therapy.     Plan: Pap, reviewed if normal no further testing, last Pap 4 years ago reports as normal, reviewed if normal no further Paps will be needed.  SBEs, continue annual 3D screening mammogram, calcium rich foods, exercise as able continue walking.  Home safety, fall prevention discussed.  Encouraged chair yoga, balance and weightbearing exercise.    Huel Cote [facility], 1:21 PM 08/11/2018

## 2018-08-11 NOTE — Patient Instructions (Signed)
Health Maintenance After Age 65 After age 65, you are at a higher risk for certain [place]-term diseases and infections as well as injuries from falls. Falls are a major cause of broken bones and head injuries in people who are older than age 65. Getting regular preventive care can help to keep you healthy and well. Preventive care includes getting regular testing and making lifestyle changes as recommended by your health care provider. Talk with your health care provider about:  Which screenings and tests you should have. A screening is a test that checks for a disease when you have no symptoms.  A diet and exercise plan that is right for you. What should I know about screenings and tests to prevent falls? Screening and testing are the best ways to find a health problem early. Early diagnosis and treatment give you the best chance of managing medical conditions that are common after age 65. Certain conditions and lifestyle choices may make you more likely to have a fall. Your health care provider may recommend:  Regular vision checks. Poor vision and conditions such as cataracts can make you more likely to have a fall. If you wear glasses, make sure to get your prescription updated if your vision changes.  Medicine review. Work with your health care provider to regularly review all of the medicines you are taking, including over-the-counter medicines. Ask your health care provider about any side effects that may make you more likely to have a fall. Tell your health care provider if any medicines that you take make you feel dizzy or sleepy.  Osteoporosis screening. Osteoporosis is a condition that causes the bones to get weaker. This can make the bones weak and cause them to break more easily.  Blood pressure screening. Blood pressure changes and medicines to control blood pressure can make you feel dizzy.  Strength and balance checks. Your health care provider may recommend certain tests to check your  strength and balance while standing, walking, or changing positions.  Foot health exam. Foot pain and numbness, as well as not wearing proper footwear, can make you more likely to have a fall.  Depression screening. You may be more likely to have a fall if you have a fear of falling, feel emotionally low, or feel unable to do activities that you used to do.  Alcohol use screening. Using too much alcohol can affect your balance and may make you more likely to have a fall. What actions can I take to lower my risk of falls? General instructions  Talk with your health care provider about your risks for falling. Tell your health care provider if: ? You fall. Be sure to tell your health care provider about all falls, even ones that seem minor. ? You feel dizzy, sleepy, or off-balance.  Take over-the-counter and prescription medicines only as told by your health care provider. These include any supplements.  Eat a healthy diet and maintain a healthy weight. A healthy diet includes low-fat dairy products, low-fat (lean) meats, and fiber from whole grains, beans, and lots of fruits and vegetables. Home safety  Remove any tripping hazards, such as rugs, cords, and clutter.  Install safety equipment such as grab bars in bathrooms and safety rails on stairs.  Keep rooms and walkways well-lit. Activity   Follow a regular exercise program to stay fit. This will help you maintain your balance. Ask your health care provider what types of exercise are appropriate for you.  If you need a cane or   walker, use it as recommended by your health care provider.  Wear supportive shoes that have nonskid soles. Lifestyle  Do not drink alcohol if your health care provider tells you not to drink.  If you drink alcohol, limit how much you have: ? 0-1 drink a day for women. ? 0-2 drinks a day for men.  Be aware of how much alcohol is in your drink. In the [place] one drink equals one typical bottle of beer (12  oz), one-half glass of wine (5 oz), or one shot of hard liquor (1 oz).  Do not use any products that contain nicotine or tobacco, such as cigarettes and e-cigarettes. If you need help quitting, ask your health care provider. Summary  Having a healthy lifestyle and getting preventive care can help to protect your health and wellness after age 65.  Screening and testing are the best way to find a health problem early and help you avoid having a fall. Early diagnosis and treatment give you the best chance for managing medical conditions that are more common for people who are older than age 65.  Falls are a major cause of broken bones and head injuries in people who are older than age 65. Take precautions to prevent a fall at home.  Work with your health care provider to learn what changes you can make to improve your health and wellness and to prevent falls. This information is not intended to replace advice given to you by your health care provider. Make sure you discuss any questions you have with your health care provider. Document Released: 12/31/2016 Document Revised: 12/31/2016 Document Reviewed: 12/31/2016 Elsevier Interactive Patient Education  2019 Elsevier Inc.  

## 2018-08-11 NOTE — Addendum Note (Signed)
Addended by: Lorine Bears on: 08/11/2018 02:21 PM   Modules accepted: Orders

## 2018-08-12 LAB — URINALYSIS, COMPLETE W/RFL CULTURE
Bacteria, UA: NONE SEEN /HPF
Bilirubin Urine: NEGATIVE
Glucose, UA: NEGATIVE
Hgb urine dipstick: NEGATIVE
Hyaline Cast: NONE SEEN /LPF
Ketones, ur: NEGATIVE
Leukocyte Esterase: NEGATIVE
Nitrites, Initial: NEGATIVE
Protein, ur: NEGATIVE
RBC / HPF: NONE SEEN /HPF (ref 0–2)
Specific Gravity, Urine: 1.013 (ref 1.001–1.03)
Squamous Epithelial / LPF: NONE SEEN /HPF (ref <=5)
WBC, UA: NONE SEEN /HPF (ref 0–5)
pH: 5.5 (ref 5.0–8.0)

## 2018-08-12 LAB — NO CULTURE INDICATED

## 2018-08-13 LAB — PAP IG W/ RFLX HPV ASCU

## 2018-09-01 ENCOUNTER — Ambulatory Visit: Payer: Medicare Other

## 2018-10-13 ENCOUNTER — Ambulatory Visit
Admission: RE | Admit: 2018-10-13 | Discharge: 2018-10-13 | Disposition: A | Discharge status: Home / Self Care | Payer: Medicare Other | Source: Ambulatory Visit | Attending: Women's Health | Admitting: Women's Health

## 2018-10-13 ENCOUNTER — Other Ambulatory Visit: Payer: Self-pay

## 2018-10-13 DIAGNOSIS — Z1231 Encounter for screening mammogram for malignant neoplasm of breast: Secondary | ICD-10-CM

## 2018-10-14 ENCOUNTER — Encounter: Payer: Self-pay | Admitting: Women's Health

## 2019-02-03 ENCOUNTER — Other Ambulatory Visit: Payer: Self-pay

## 2019-02-03 DIAGNOSIS — Z20822 Contact with and (suspected) exposure to covid-19: Secondary | ICD-10-CM

## 2019-02-06 LAB — NOVEL CORONAVIRUS, NAA: SARS-CoV-2, NAA: NOT DETECTED

## 2019-03-08 DIAGNOSIS — M25551 Pain in right hip: Secondary | ICD-10-CM | POA: Diagnosis not present

## 2019-03-08 DIAGNOSIS — M8580 Other specified disorders of bone density and structure, unspecified site: Secondary | ICD-10-CM | POA: Diagnosis not present

## 2019-03-08 DIAGNOSIS — M150 Primary generalized (osteo)arthritis: Secondary | ICD-10-CM | POA: Diagnosis not present

## 2019-03-08 DIAGNOSIS — M79671 Pain in right foot: Secondary | ICD-10-CM | POA: Diagnosis not present

## 2019-03-08 DIAGNOSIS — Z6823 Body mass index (BMI) 23.0-23.9, adult: Secondary | ICD-10-CM | POA: Diagnosis not present

## 2019-03-08 DIAGNOSIS — M0589 Other rheumatoid arthritis with rheumatoid factor of multiple sites: Secondary | ICD-10-CM | POA: Diagnosis not present

## 2019-04-09 ENCOUNTER — Ambulatory Visit: Payer: Medicare PPO | Attending: Internal Medicine

## 2019-04-09 DIAGNOSIS — Z23 Encounter for immunization: Secondary | ICD-10-CM | POA: Insufficient documentation

## 2019-04-09 NOTE — Progress Notes (Signed)
   Covid-19 Vaccination Clinic  Name:  Sylvia Reynolds    MRN: [id number] DOB: [birth year]  04/09/2019  [name] was observed post Covid-19 immunization for 15 minutes without incidence. She was provided with Vaccine Information Sheet and instruction to access the V-Safe system.   [name] was instructed to call 911 with any severe reactions post vaccine: Marland Kitchen Difficulty breathing  . Swelling of your face and throat  . A fast heartbeat  . A bad rash all over your body  . Dizziness and weakness    Immunizations Administered    Name Date Dose VIS Date Route   Pfizer COVID-19 Vaccine 04/09/2019 10:19 AM 0.3 mL 02/11/2019 Intramuscular   Manufacturer: [place]   Lot: [id number]   [place]: [id number]

## 2019-04-27 ENCOUNTER — Ambulatory Visit: Payer: Medicare Other

## 2019-05-03 ENCOUNTER — Ambulatory Visit: Payer: Medicare PPO | Attending: Internal Medicine

## 2019-05-03 ENCOUNTER — Ambulatory Visit: Payer: Medicare PPO

## 2019-05-03 DIAGNOSIS — Z23 Encounter for immunization: Secondary | ICD-10-CM

## 2019-05-03 NOTE — Progress Notes (Signed)
   Covid-19 Vaccination Clinic  Name:  Sylvia Reynolds    MRN: [id number] DOB: [birth year]  05/03/2019  [name] was observed post Covid-19 immunization for 15 minutes without incident. She was provided with Vaccine Information Sheet and instruction to access the V-Safe system.   [name] was instructed to call 911 with any severe reactions post vaccine: Marland Kitchen Difficulty breathing  . Swelling of face and throat  . A fast heartbeat  . A bad rash all over body  . Dizziness and weakness   Immunizations Administered    Name Date Dose VIS Date Route   Pfizer COVID-19 Vaccine 05/03/2019 12:16 PM 0.3 mL 02/11/2019 Intramuscular   Manufacturer: [place]   Lot: [id number]   [place]: [id number]

## 2019-06-14 DIAGNOSIS — M79671 Pain in right foot: Secondary | ICD-10-CM | POA: Diagnosis not present

## 2019-06-14 DIAGNOSIS — M25551 Pain in right hip: Secondary | ICD-10-CM | POA: Diagnosis not present

## 2019-06-14 DIAGNOSIS — M8580 Other specified disorders of bone density and structure, unspecified site: Secondary | ICD-10-CM | POA: Diagnosis not present

## 2019-06-14 DIAGNOSIS — M150 Primary generalized (osteo)arthritis: Secondary | ICD-10-CM | POA: Diagnosis not present

## 2019-06-14 DIAGNOSIS — Z6823 Body mass index (BMI) 23.0-23.9, adult: Secondary | ICD-10-CM | POA: Diagnosis not present

## 2019-06-14 DIAGNOSIS — M0589 Other rheumatoid arthritis with rheumatoid factor of multiple sites: Secondary | ICD-10-CM | POA: Diagnosis not present

## 2019-09-02 ENCOUNTER — Other Ambulatory Visit: Payer: Self-pay | Admitting: Internal Medicine

## 2019-09-02 DIAGNOSIS — Z1231 Encounter for screening mammogram for malignant neoplasm of breast: Secondary | ICD-10-CM

## 2019-10-11 ENCOUNTER — Other Ambulatory Visit: Payer: Medicare PPO

## 2019-10-11 ENCOUNTER — Other Ambulatory Visit: Payer: Self-pay | Admitting: Cardiology

## 2019-10-11 DIAGNOSIS — Z20822 Contact with and (suspected) exposure to covid-19: Secondary | ICD-10-CM

## 2019-10-13 LAB — NOVEL CORONAVIRUS, NAA: SARS-CoV-2, NAA: NOT DETECTED

## 2019-10-13 LAB — SARS-COV-2, NAA 2 DAY TAT

## 2019-10-20 ENCOUNTER — Ambulatory Visit: Payer: Medicare PPO

## 2019-10-21 ENCOUNTER — Other Ambulatory Visit: Payer: Self-pay

## 2019-10-21 ENCOUNTER — Ambulatory Visit
Admission: RE | Admit: 2019-10-21 | Discharge: 2019-10-21 | Disposition: A | Discharge status: Home / Self Care | Payer: Medicare PPO | Source: Ambulatory Visit | Attending: Internal Medicine | Admitting: Internal Medicine

## 2019-10-21 DIAGNOSIS — Z1231 Encounter for screening mammogram for malignant neoplasm of breast: Secondary | ICD-10-CM | POA: Diagnosis not present

## 2019-10-26 DIAGNOSIS — Z0000 Encounter for general adult medical examination without abnormal findings: Secondary | ICD-10-CM | POA: Diagnosis not present

## 2019-10-26 DIAGNOSIS — I10 Essential (primary) hypertension: Secondary | ICD-10-CM | POA: Diagnosis not present

## 2019-10-26 DIAGNOSIS — E785 Hyperlipidemia, unspecified: Secondary | ICD-10-CM | POA: Diagnosis not present

## 2019-10-26 DIAGNOSIS — M810 Age-related osteoporosis without current pathological fracture: Secondary | ICD-10-CM | POA: Diagnosis not present

## 2019-11-02 DIAGNOSIS — M150 Primary generalized (osteo)arthritis: Secondary | ICD-10-CM | POA: Diagnosis not present

## 2019-11-02 DIAGNOSIS — M069 Rheumatoid arthritis, unspecified: Secondary | ICD-10-CM | POA: Diagnosis not present

## 2019-11-02 DIAGNOSIS — E785 Hyperlipidemia, unspecified: Secondary | ICD-10-CM | POA: Diagnosis not present

## 2019-11-02 DIAGNOSIS — D709 Neutropenia, unspecified: Secondary | ICD-10-CM | POA: Diagnosis not present

## 2019-11-02 DIAGNOSIS — I10 Essential (primary) hypertension: Secondary | ICD-10-CM | POA: Diagnosis not present

## 2019-11-02 DIAGNOSIS — F40298 Other specified phobia: Secondary | ICD-10-CM | POA: Diagnosis not present

## 2019-11-02 DIAGNOSIS — M810 Age-related osteoporosis without current pathological fracture: Secondary | ICD-10-CM | POA: Diagnosis not present

## 2019-11-02 DIAGNOSIS — Z0000 Encounter for general adult medical examination without abnormal findings: Secondary | ICD-10-CM | POA: Diagnosis not present

## 2019-11-10 DIAGNOSIS — H524 Presbyopia: Secondary | ICD-10-CM | POA: Diagnosis not present

## 2019-11-10 DIAGNOSIS — H5203 Hypermetropia, bilateral: Secondary | ICD-10-CM | POA: Diagnosis not present

## 2019-11-10 DIAGNOSIS — H2513 Age-related nuclear cataract, bilateral: Secondary | ICD-10-CM | POA: Diagnosis not present

## 2019-11-29 ENCOUNTER — Other Ambulatory Visit: Payer: Medicare PPO

## 2019-11-29 DIAGNOSIS — Z20822 Contact with and (suspected) exposure to covid-19: Secondary | ICD-10-CM

## 2019-11-30 LAB — SARS-COV-2, NAA 2 DAY TAT

## 2019-11-30 LAB — NOVEL CORONAVIRUS, NAA: SARS-CoV-2, NAA: NOT DETECTED

## 2019-12-01 DIAGNOSIS — D2271 Melanocytic nevi of right lower limb, including hip: Secondary | ICD-10-CM | POA: Diagnosis not present

## 2019-12-01 DIAGNOSIS — D1801 Hemangioma of skin and subcutaneous tissue: Secondary | ICD-10-CM | POA: Diagnosis not present

## 2019-12-01 DIAGNOSIS — L821 Other seborrheic keratosis: Secondary | ICD-10-CM | POA: Diagnosis not present

## 2019-12-01 DIAGNOSIS — D3617 Benign neoplasm of peripheral nerves and autonomic nervous system of trunk, unspecified: Secondary | ICD-10-CM | POA: Diagnosis not present

## 2019-12-01 DIAGNOSIS — Z85828 Personal history of other malignant neoplasm of skin: Secondary | ICD-10-CM | POA: Diagnosis not present

## 2019-12-01 DIAGNOSIS — L718 Other rosacea: Secondary | ICD-10-CM | POA: Diagnosis not present

## 2019-12-20 DIAGNOSIS — Z6823 Body mass index (BMI) 23.0-23.9, adult: Secondary | ICD-10-CM | POA: Diagnosis not present

## 2019-12-20 DIAGNOSIS — M25551 Pain in right hip: Secondary | ICD-10-CM | POA: Diagnosis not present

## 2019-12-20 DIAGNOSIS — M150 Primary generalized (osteo)arthritis: Secondary | ICD-10-CM | POA: Diagnosis not present

## 2019-12-20 DIAGNOSIS — M79671 Pain in right foot: Secondary | ICD-10-CM | POA: Diagnosis not present

## 2019-12-20 DIAGNOSIS — M0589 Other rheumatoid arthritis with rheumatoid factor of multiple sites: Secondary | ICD-10-CM | POA: Diagnosis not present

## 2019-12-20 DIAGNOSIS — M8580 Other specified disorders of bone density and structure, unspecified site: Secondary | ICD-10-CM | POA: Diagnosis not present

## 2019-12-27 ENCOUNTER — Ambulatory Visit: Payer: Medicare PPO | Attending: Internal Medicine

## 2019-12-27 DIAGNOSIS — Z23 Encounter for immunization: Secondary | ICD-10-CM

## 2019-12-27 NOTE — Progress Notes (Signed)
   Covid-19 Vaccination Clinic  Name:  Sylvia Reynolds    MRN: [id number] DOB: [birth year]  12/27/2019  Sylvia Reynolds was observed post Covid-19 immunization for 15 minutes without incident. She was provided with Vaccine Information Sheet and instruction to access the V-Safe system.   Sylvia Reynolds was instructed to call 911 with any severe reactions post vaccine: Marland Kitchen Difficulty breathing  . Swelling of face and throat  . A fast heartbeat  . A bad rash all over body  . Dizziness and weakness

## 2020-01-13 DIAGNOSIS — Z23 Encounter for immunization: Secondary | ICD-10-CM | POA: Diagnosis not present

## 2020-02-08 DIAGNOSIS — F4322 Adjustment disorder with anxiety: Secondary | ICD-10-CM | POA: Diagnosis not present

## 2020-02-17 DIAGNOSIS — F4323 Adjustment disorder with mixed anxiety and depressed mood: Secondary | ICD-10-CM | POA: Diagnosis not present

## 2020-02-28 DIAGNOSIS — F4323 Adjustment disorder with mixed anxiety and depressed mood: Secondary | ICD-10-CM | POA: Diagnosis not present

## 2020-03-06 DIAGNOSIS — F4323 Adjustment disorder with mixed anxiety and depressed mood: Secondary | ICD-10-CM | POA: Diagnosis not present

## 2020-03-14 DIAGNOSIS — F4323 Adjustment disorder with mixed anxiety and depressed mood: Secondary | ICD-10-CM | POA: Diagnosis not present

## 2020-03-21 DIAGNOSIS — M0589 Other rheumatoid arthritis with rheumatoid factor of multiple sites: Secondary | ICD-10-CM | POA: Diagnosis not present

## 2020-03-28 DIAGNOSIS — F4323 Adjustment disorder with mixed anxiety and depressed mood: Secondary | ICD-10-CM | POA: Diagnosis not present

## 2020-04-11 DIAGNOSIS — F4323 Adjustment disorder with mixed anxiety and depressed mood: Secondary | ICD-10-CM | POA: Diagnosis not present

## 2020-04-17 DIAGNOSIS — F4323 Adjustment disorder with mixed anxiety and depressed mood: Secondary | ICD-10-CM | POA: Diagnosis not present

## 2020-04-19 DIAGNOSIS — Z6823 Body mass index (BMI) 23.0-23.9, adult: Secondary | ICD-10-CM | POA: Diagnosis not present

## 2020-04-19 DIAGNOSIS — M8580 Other specified disorders of bone density and structure, unspecified site: Secondary | ICD-10-CM | POA: Diagnosis not present

## 2020-04-19 DIAGNOSIS — M150 Primary generalized (osteo)arthritis: Secondary | ICD-10-CM | POA: Diagnosis not present

## 2020-04-19 DIAGNOSIS — M79671 Pain in right foot: Secondary | ICD-10-CM | POA: Diagnosis not present

## 2020-04-19 DIAGNOSIS — M25551 Pain in right hip: Secondary | ICD-10-CM | POA: Diagnosis not present

## 2020-04-19 DIAGNOSIS — M25572 Pain in left ankle and joints of left foot: Secondary | ICD-10-CM | POA: Diagnosis not present

## 2020-04-19 DIAGNOSIS — M0589 Other rheumatoid arthritis with rheumatoid factor of multiple sites: Secondary | ICD-10-CM | POA: Diagnosis not present

## 2020-04-26 DIAGNOSIS — F4323 Adjustment disorder with mixed anxiety and depressed mood: Secondary | ICD-10-CM | POA: Diagnosis not present

## 2020-05-03 DIAGNOSIS — F4323 Adjustment disorder with mixed anxiety and depressed mood: Secondary | ICD-10-CM | POA: Diagnosis not present

## 2020-05-09 DIAGNOSIS — F4323 Adjustment disorder with mixed anxiety and depressed mood: Secondary | ICD-10-CM | POA: Diagnosis not present

## 2020-05-23 DIAGNOSIS — F4323 Adjustment disorder with mixed anxiety and depressed mood: Secondary | ICD-10-CM | POA: Diagnosis not present

## 2020-05-29 DIAGNOSIS — F4323 Adjustment disorder with mixed anxiety and depressed mood: Secondary | ICD-10-CM | POA: Diagnosis not present

## 2020-06-06 DIAGNOSIS — F4323 Adjustment disorder with mixed anxiety and depressed mood: Secondary | ICD-10-CM | POA: Diagnosis not present

## 2020-06-20 DIAGNOSIS — F4323 Adjustment disorder with mixed anxiety and depressed mood: Secondary | ICD-10-CM | POA: Diagnosis not present

## 2020-06-26 DIAGNOSIS — F4323 Adjustment disorder with mixed anxiety and depressed mood: Secondary | ICD-10-CM | POA: Diagnosis not present

## 2020-06-27 DIAGNOSIS — Z6823 Body mass index (BMI) 23.0-23.9, adult: Secondary | ICD-10-CM | POA: Diagnosis not present

## 2020-06-27 DIAGNOSIS — M25572 Pain in left ankle and joints of left foot: Secondary | ICD-10-CM | POA: Diagnosis not present

## 2020-06-27 DIAGNOSIS — M79671 Pain in right foot: Secondary | ICD-10-CM | POA: Diagnosis not present

## 2020-06-27 DIAGNOSIS — M8580 Other specified disorders of bone density and structure, unspecified site: Secondary | ICD-10-CM | POA: Diagnosis not present

## 2020-06-27 DIAGNOSIS — M150 Primary generalized (osteo)arthritis: Secondary | ICD-10-CM | POA: Diagnosis not present

## 2020-06-27 DIAGNOSIS — M25551 Pain in right hip: Secondary | ICD-10-CM | POA: Diagnosis not present

## 2020-06-27 DIAGNOSIS — M0589 Other rheumatoid arthritis with rheumatoid factor of multiple sites: Secondary | ICD-10-CM | POA: Diagnosis not present

## 2020-06-28 ENCOUNTER — Other Ambulatory Visit (HOSPITAL_BASED_OUTPATIENT_CLINIC_OR_DEPARTMENT_OTHER): Payer: Self-pay

## 2020-06-28 ENCOUNTER — Other Ambulatory Visit: Payer: Self-pay

## 2020-06-28 ENCOUNTER — Ambulatory Visit: Payer: Medicare PPO | Attending: Internal Medicine

## 2020-06-28 ENCOUNTER — Other Ambulatory Visit (HOSPITAL_COMMUNITY): Payer: Self-pay

## 2020-06-28 DIAGNOSIS — Z23 Encounter for immunization: Secondary | ICD-10-CM

## 2020-06-28 MED ORDER — COVID-19 MRNA VACC (MODERNA) 100 MCG/0.5ML IM SUSP
INTRAMUSCULAR | 0 refills | Status: DC
  Filled 2020-06-28: qty 0.25, 1d supply, fill #0

## 2020-06-28 NOTE — Progress Notes (Signed)
   Covid-19 Vaccination Clinic  Name:  ZANIYA MCAULAY    MRN: [id number] DOB: April 11, 1950  06/28/2020  Ms. Arpin was observed post Covid-19 immunization for 15 minutes without incident. She was provided with Vaccine Information Sheet and instruction to access the V-Safe system.   Ms. Veldhuizen was instructed to call 911 with any severe reactions post vaccine: Marland Kitchen Difficulty breathing  . Swelling of face and throat  . A fast heartbeat  . A bad rash all over body  . Dizziness and weakness   Immunizations Administered    Name Date Dose VIS Date Route   Moderna Covid-19 Booster Vaccine 06/28/2020  1:23 PM 0.25 mL 12/21/2019 Intramuscular   Manufacturer: Moderna   Lot: [id number]   [place]: [id number]

## 2020-07-04 DIAGNOSIS — F4323 Adjustment disorder with mixed anxiety and depressed mood: Secondary | ICD-10-CM | POA: Diagnosis not present

## 2020-07-17 DIAGNOSIS — F4323 Adjustment disorder with mixed anxiety and depressed mood: Secondary | ICD-10-CM | POA: Diagnosis not present

## 2020-08-01 DIAGNOSIS — F4323 Adjustment disorder with mixed anxiety and depressed mood: Secondary | ICD-10-CM | POA: Diagnosis not present

## 2020-08-15 DIAGNOSIS — F4322 Adjustment disorder with anxiety: Secondary | ICD-10-CM | POA: Diagnosis not present

## 2020-08-29 DIAGNOSIS — F4323 Adjustment disorder with mixed anxiety and depressed mood: Secondary | ICD-10-CM | POA: Diagnosis not present

## 2020-09-10 ENCOUNTER — Other Ambulatory Visit: Payer: Self-pay | Admitting: Internal Medicine

## 2020-09-10 DIAGNOSIS — Z1231 Encounter for screening mammogram for malignant neoplasm of breast: Secondary | ICD-10-CM

## 2020-09-12 DIAGNOSIS — F4323 Adjustment disorder with mixed anxiety and depressed mood: Secondary | ICD-10-CM | POA: Diagnosis not present

## 2020-09-26 ENCOUNTER — Ambulatory Visit: Payer: Medicare Other | Admitting: Obstetrics & Gynecology

## 2020-09-26 DIAGNOSIS — F4323 Adjustment disorder with mixed anxiety and depressed mood: Secondary | ICD-10-CM | POA: Diagnosis not present

## 2020-09-28 DIAGNOSIS — M79671 Pain in right foot: Secondary | ICD-10-CM | POA: Diagnosis not present

## 2020-09-28 DIAGNOSIS — M150 Primary generalized (osteo)arthritis: Secondary | ICD-10-CM | POA: Diagnosis not present

## 2020-09-28 DIAGNOSIS — M25551 Pain in right hip: Secondary | ICD-10-CM | POA: Diagnosis not present

## 2020-09-28 DIAGNOSIS — M25572 Pain in left ankle and joints of left foot: Secondary | ICD-10-CM | POA: Diagnosis not present

## 2020-09-28 DIAGNOSIS — M8580 Other specified disorders of bone density and structure, unspecified site: Secondary | ICD-10-CM | POA: Diagnosis not present

## 2020-09-28 DIAGNOSIS — Z6823 Body mass index (BMI) 23.0-23.9, adult: Secondary | ICD-10-CM | POA: Diagnosis not present

## 2020-09-28 DIAGNOSIS — M0589 Other rheumatoid arthritis with rheumatoid factor of multiple sites: Secondary | ICD-10-CM | POA: Diagnosis not present

## 2020-10-05 ENCOUNTER — Encounter: Payer: Self-pay | Admitting: Obstetrics & Gynecology

## 2020-10-05 ENCOUNTER — Other Ambulatory Visit: Payer: Self-pay

## 2020-10-05 ENCOUNTER — Ambulatory Visit (INDEPENDENT_AMBULATORY_CARE_PROVIDER_SITE_OTHER): Payer: Medicare PPO | Admitting: Obstetrics & Gynecology

## 2020-10-05 VITALS — BP 130/70 | HR 66 | Resp 14 | Ht 58.25 in | Wt 118.0 lb

## 2020-10-05 DIAGNOSIS — Z780 Asymptomatic menopausal state: Secondary | ICD-10-CM

## 2020-10-05 DIAGNOSIS — M8580 Other specified disorders of bone density and structure, unspecified site: Secondary | ICD-10-CM

## 2020-10-05 DIAGNOSIS — Z01419 Encounter for gynecological examination (general) (routine) without abnormal findings: Secondary | ICD-10-CM

## 2020-10-05 NOTE — Progress Notes (Signed)
[place] 15-May-1991 [id number]   History:    70 y.o. G1P1L2  Married. Twin pregnancy.  1 daughter with cerebral palsy will come back to live with them.  RP:  Established patient presenting for annual gyn exam   HPI: Postmenopausal, well on no HRT.  No PMB.  No pelvic pain.  Abstinent as husband had Prostate Ca.  Breasts normal.  Normal Pap and mammogram history.   2016 Neg colonoscopy. Osteopenia BD 10/2019, primary care manages.  Hypertension, rheumatoid arthritis on methotrexate.  Has had Pneumovax but not Shingrix due to the methotrexate.  BMI 24.45.  Health labs with Fam MD.  Past medical history,surgical history, family history and social history were all reviewed and documented in the EPIC chart.  Gynecologic History No LMP recorded. Patient is postmenopausal.  Obstetric History OB History  Gravida Para Term Preterm AB Living  '1 1   1   2  '$ SAB IAB Ectopic Multiple Live Births        1 2    # Outcome Date GA Lbr Len/2nd Weight Sex Delivery Anes PTL Lv  1A Preterm 08/25/83 [redacted]w[redacted]d 4 lb 13 oz (2.183 kg) F Vag-Forceps  Y LIV  1B Preterm 08/25/83 [id number] lb (1.814 kg) F CS-Unspec  Y LIV     ROS: A ROS was performed and pertinent positives and negatives are included in the history.  GENERAL: No fevers or chills. HEENT: No change in vision, no earache, sore throat or sinus congestion. NECK: No pain or stiffness. CARDIOVASCULAR: No chest pain or pressure. No palpitations. PULMONARY: No shortness of breath, cough or wheeze. GASTROINTESTINAL: No abdominal pain, nausea, vomiting or diarrhea, melena or bright red blood per rectum. GENITOURINARY: No urinary frequency, urgency, hesitancy or dysuria. MUSCULOSKELETAL: No joint or muscle pain, no back pain, no recent trauma. DERMATOLOGIC: No rash, no itching, no lesions. ENDOCRINE: No polyuria, polydipsia, no heat or cold intolerance. No recent change in weight. HEMATOLOGICAL: No anemia or easy bruising or bleeding. NEUROLOGIC: No headache,  seizures, numbness, tingling or weakness. PSYCHIATRIC: No depression, no loss of interest in normal activity or change in sleep pattern.     Exam:   BP 130/70 (BP Location: Right Arm, Patient Position: Sitting, Cuff Size: Normal)   Pulse 66   Resp 14   Ht 4' 10.25" (1.48 m)   Wt 118 lb (53.5 kg)   BMI 24.45 kg/m   Body mass index is 24.45 kg/m.  General appearance : Well developed well nourished female. No acute distress HEENT: Eyes: no retinal hemorrhage or exudates,  Neck supple, trachea midline, no carotid bruits, no thyroidmegaly Lungs: Clear to auscultation, no rhonchi or wheezes, or rib retractions  Heart: Regular rate and rhythm, no murmurs or gallops Breast:Examined in sitting and supine position were symmetrical in appearance, no palpable masses or tenderness,  no skin retraction, no nipple inversion, no nipple discharge, no skin discoloration, no axillary or supraclavicular lymphadenopathy Abdomen: no palpable masses or tenderness, no rebound or guarding Extremities: no edema or skin discoloration or tenderness  Pelvic: Vulva: Normal             Vagina: No gross lesions or discharge  Cervix: No gross lesions or discharge  Uterus  AV, normal size, shape and consistency, non-tender and mobile  Adnexa  Without masses or tenderness  Anus: Normal   Assessment/Plan:  [age].o. female for annual exam   1. Well female exam with routine gynecological exam Normal gynecologic exam in menopause.  Pap test negative in 2020.  No indication for Pap test this year.  Breast exam normal.  Screening mammogram scheduled September 2022.  Colonoscopy in 2016.  Health labs with family physician. Good body mass index at 24.45.  Continue with fitness and healthy nutrition.  2. Postmenopause Well on no hormone replacement therapy.  No postmenopausal bleeding.  3. Osteopenia, unspecified location  Osteopenia on bone density August 2021.  We will repeat a bone density in August 2023.  Vitamin D  supplements, good calcium intake in nutrition.  Weightbearing physical activities to continue.  Princess Bruins MD, 11:27 AM 10/05/2020

## 2020-10-09 DIAGNOSIS — Z23 Encounter for immunization: Secondary | ICD-10-CM | POA: Diagnosis not present

## 2020-10-10 DIAGNOSIS — F4323 Adjustment disorder with mixed anxiety and depressed mood: Secondary | ICD-10-CM | POA: Diagnosis not present

## 2020-10-12 ENCOUNTER — Ambulatory Visit: Payer: Medicare PPO | Admitting: Podiatry

## 2020-10-24 ENCOUNTER — Ambulatory Visit: Payer: Medicare PPO | Admitting: Podiatry

## 2020-10-24 ENCOUNTER — Other Ambulatory Visit: Payer: Self-pay

## 2020-10-24 DIAGNOSIS — B351 Tinea unguium: Secondary | ICD-10-CM

## 2020-10-24 DIAGNOSIS — F4323 Adjustment disorder with mixed anxiety and depressed mood: Secondary | ICD-10-CM | POA: Diagnosis not present

## 2020-10-24 DIAGNOSIS — Q828 Other specified congenital malformations of skin: Secondary | ICD-10-CM | POA: Diagnosis not present

## 2020-10-25 NOTE — Progress Notes (Signed)
Subjective:  Patient ID: Sylvia Reynolds, female    DOB: 03-14-1950,  MRN: [id number]  Chief Complaint  Patient presents with   Nail Problem    Callus Nail fungus  Foot assessment     70 y.o. female presents with the above complaint.  Patient presents with complaint of right hallux onychomycosis.  Patient states it has progressed to gotten worse.  She has not done anything for it.  She has tried some over-the-counter medication she would like to discuss treatment options.  She also has a callus to the right submetatarsal 2 is painful to walk on has progressed to gotten worse.  She would like to have that debrided down.  She has not seen and was prior to seeing me.  She denies any other acute complaints.   Review of Systems: Negative except as noted in the HPI. Denies N/V/F/Ch.  Past Medical History:  Diagnosis Date   Arthritis    Closed fracture dislocation of right elbow    Hypertension    Osteopenia    Rheumatoid arthritis ([place])    Scoliosis    Shingles     Current Outpatient Medications:    Ascorbic Acid (VITAMIN C) 1000 MG tablet, Take 1,000 mg by mouth daily., Disp: , Rfl:    atenolol (TENORMIN) 25 MG tablet, Take 25 mg by mouth daily., Disp: , Rfl:    benazepril (LOTENSIN) 20 MG tablet, Take 20 mg by mouth daily., Disp: , Rfl:    calcium citrate-vitamin D (CITRACAL+D) 315-200 MG-UNIT tablet, Take 2 tablets by mouth daily., Disp: , Rfl:    folic acid (FOLVITE) 1 MG tablet, Take 1 mg by mouth daily., Disp: , Rfl:    loratadine (CLARITIN) 10 MG tablet, Take 10 mg by mouth daily., Disp: , Rfl:    Lysine 1000 MG TABS, Take 1 tablet by mouth daily., Disp: , Rfl:    methotrexate 2.5 MG tablet, Take 15 mg by mouth once a week., Disp: , Rfl:    Multiple Vitamins-Minerals (MULTI COMPLETE/IRON PO), Take 1 tablet by mouth 2 (two) times daily., Disp: , Rfl:    Omega-3 Fatty Acids (FISH OIL) 1200 MG CAPS, Take 2 capsules by mouth daily., Disp: , Rfl:   Social History   Tobacco Use   Smoking Status Never  Smokeless Tobacco Never    Allergies  Allergen Reactions   Zithromax [Azithromycin] Diarrhea   Ceftin [Cefuroxime Axetil] Nausea Only   Tape Rash   Objective:  There were no vitals filed for this visit. There is no height or weight on file to calculate BMI. Constitutional Well developed. Well nourished.  Vascular Dorsalis pedis pulses palpable bilaterally. Posterior tibial pulses palpable bilaterally. Capillary refill normal to all digits.  No cyanosis or clubbing noted. Pedal hair growth normal.  Neurologic Normal speech. Oriented to person, place, and time. Epicritic sensation to light touch grossly present bilaterally.  Dermatologic Nails right hallux thickened elongated dystrophic mycotic toenails x1.  Mild pain on palpation.  Mycotic nature to it Skin central nucleated porokeratotic lesion with pain on palpation noted to right submetatarsal 2.  Orthopedic: Normal joint ROM without pain or crepitus bilaterally. No visible deformities. No bony tenderness.   Radiographs: None Assessment:   1. Onychomycosis due to dermatophyte   2. Nail fungus   3. Porokeratosis    Plan:  Patient was evaluated and treated and all questions answered.  Right hallux onychomycosis -Educated the patient on the etiology of onychomycosis and various treatment options associated with improving the fungal  load.  I explained to the patient that there is 3 treatment options available to treat the onychomycosis including topical, p.o., laser treatment.  Patient elected to undergo laser treatment.  I discussed with her the chance to think about 6 or 7 sessions to see improvement.  I discussed with her the benefits of laser with pros and cons.  Patient states understanding would like to proceed with laser -She will be scheduled with laser technician  Right submetatarsal 2 porokeratotic lesion -I explained the patient the etiology of porokeratosis and various treatment options  were discussed.  Given the amount of pain that she is having she will benefit from a debridement of the lesion.  Using chisel blade and handle the lesion was debrided down to healthy striated tissue followed by excision of central nucleated core.  Patient states understanding.  No complication no pinpoint bleeding noted     No follow-ups on file.

## 2020-10-28 DIAGNOSIS — Z20822 Contact with and (suspected) exposure to covid-19: Secondary | ICD-10-CM | POA: Diagnosis not present

## 2020-11-02 ENCOUNTER — Ambulatory Visit: Payer: Medicare PPO

## 2020-11-07 DIAGNOSIS — I10 Essential (primary) hypertension: Secondary | ICD-10-CM | POA: Diagnosis not present

## 2020-11-07 DIAGNOSIS — D696 Thrombocytopenia, unspecified: Secondary | ICD-10-CM | POA: Diagnosis not present

## 2020-11-07 DIAGNOSIS — Z0000 Encounter for general adult medical examination without abnormal findings: Secondary | ICD-10-CM | POA: Diagnosis not present

## 2020-11-07 DIAGNOSIS — R5383 Other fatigue: Secondary | ICD-10-CM | POA: Diagnosis not present

## 2020-11-07 DIAGNOSIS — F4323 Adjustment disorder with mixed anxiety and depressed mood: Secondary | ICD-10-CM | POA: Diagnosis not present

## 2020-11-07 DIAGNOSIS — E785 Hyperlipidemia, unspecified: Secondary | ICD-10-CM | POA: Diagnosis not present

## 2020-11-13 ENCOUNTER — Ambulatory Visit
Admission: RE | Admit: 2020-11-13 | Discharge: 2020-11-13 | Disposition: A | Discharge status: Home / Self Care | Payer: Medicare PPO | Source: Ambulatory Visit | Attending: Internal Medicine | Admitting: Internal Medicine

## 2020-11-13 ENCOUNTER — Other Ambulatory Visit: Payer: Self-pay

## 2020-11-13 DIAGNOSIS — Z1231 Encounter for screening mammogram for malignant neoplasm of breast: Secondary | ICD-10-CM | POA: Diagnosis not present

## 2020-11-14 DIAGNOSIS — M810 Age-related osteoporosis without current pathological fracture: Secondary | ICD-10-CM | POA: Diagnosis not present

## 2020-11-14 DIAGNOSIS — I10 Essential (primary) hypertension: Secondary | ICD-10-CM | POA: Diagnosis not present

## 2020-11-14 DIAGNOSIS — E785 Hyperlipidemia, unspecified: Secondary | ICD-10-CM | POA: Diagnosis not present

## 2020-11-14 DIAGNOSIS — D709 Neutropenia, unspecified: Secondary | ICD-10-CM | POA: Diagnosis not present

## 2020-11-14 DIAGNOSIS — Z0000 Encounter for general adult medical examination without abnormal findings: Secondary | ICD-10-CM | POA: Diagnosis not present

## 2020-11-14 DIAGNOSIS — M069 Rheumatoid arthritis, unspecified: Secondary | ICD-10-CM | POA: Diagnosis not present

## 2020-11-14 DIAGNOSIS — M150 Primary generalized (osteo)arthritis: Secondary | ICD-10-CM | POA: Diagnosis not present

## 2020-11-14 DIAGNOSIS — F40298 Other specified phobia: Secondary | ICD-10-CM | POA: Diagnosis not present

## 2020-11-21 DIAGNOSIS — F4323 Adjustment disorder with mixed anxiety and depressed mood: Secondary | ICD-10-CM | POA: Diagnosis not present

## 2020-11-23 DIAGNOSIS — Z23 Encounter for immunization: Secondary | ICD-10-CM | POA: Diagnosis not present

## 2020-12-19 DIAGNOSIS — F4323 Adjustment disorder with mixed anxiety and depressed mood: Secondary | ICD-10-CM | POA: Diagnosis not present

## 2021-01-01 DIAGNOSIS — M0589 Other rheumatoid arthritis with rheumatoid factor of multiple sites: Secondary | ICD-10-CM | POA: Diagnosis not present

## 2021-01-02 DIAGNOSIS — F4323 Adjustment disorder with mixed anxiety and depressed mood: Secondary | ICD-10-CM | POA: Diagnosis not present

## 2021-01-16 DIAGNOSIS — F4323 Adjustment disorder with mixed anxiety and depressed mood: Secondary | ICD-10-CM | POA: Diagnosis not present

## 2021-01-30 DIAGNOSIS — F4323 Adjustment disorder with mixed anxiety and depressed mood: Secondary | ICD-10-CM | POA: Diagnosis not present

## 2021-02-13 DIAGNOSIS — F4323 Adjustment disorder with mixed anxiety and depressed mood: Secondary | ICD-10-CM | POA: Diagnosis not present

## 2021-03-13 DIAGNOSIS — F4323 Adjustment disorder with mixed anxiety and depressed mood: Secondary | ICD-10-CM | POA: Diagnosis not present

## 2021-03-21 DIAGNOSIS — H2513 Age-related nuclear cataract, bilateral: Secondary | ICD-10-CM | POA: Diagnosis not present

## 2021-03-21 DIAGNOSIS — H5203 Hypermetropia, bilateral: Secondary | ICD-10-CM | POA: Diagnosis not present

## 2021-03-27 DIAGNOSIS — F4323 Adjustment disorder with mixed anxiety and depressed mood: Secondary | ICD-10-CM | POA: Diagnosis not present

## 2021-04-03 DIAGNOSIS — M25551 Pain in right hip: Secondary | ICD-10-CM | POA: Diagnosis not present

## 2021-04-03 DIAGNOSIS — M8580 Other specified disorders of bone density and structure, unspecified site: Secondary | ICD-10-CM | POA: Diagnosis not present

## 2021-04-03 DIAGNOSIS — M79671 Pain in right foot: Secondary | ICD-10-CM | POA: Diagnosis not present

## 2021-04-03 DIAGNOSIS — M150 Primary generalized (osteo)arthritis: Secondary | ICD-10-CM | POA: Diagnosis not present

## 2021-04-03 DIAGNOSIS — M25572 Pain in left ankle and joints of left foot: Secondary | ICD-10-CM | POA: Diagnosis not present

## 2021-04-03 DIAGNOSIS — Z6823 Body mass index (BMI) 23.0-23.9, adult: Secondary | ICD-10-CM | POA: Diagnosis not present

## 2021-04-03 DIAGNOSIS — M0589 Other rheumatoid arthritis with rheumatoid factor of multiple sites: Secondary | ICD-10-CM | POA: Diagnosis not present

## 2021-04-10 DIAGNOSIS — F4323 Adjustment disorder with mixed anxiety and depressed mood: Secondary | ICD-10-CM | POA: Diagnosis not present

## 2021-04-26 DIAGNOSIS — F4323 Adjustment disorder with mixed anxiety and depressed mood: Secondary | ICD-10-CM | POA: Diagnosis not present

## 2021-05-08 DIAGNOSIS — F4323 Adjustment disorder with mixed anxiety and depressed mood: Secondary | ICD-10-CM | POA: Diagnosis not present

## 2021-05-22 DIAGNOSIS — F4323 Adjustment disorder with mixed anxiety and depressed mood: Secondary | ICD-10-CM | POA: Diagnosis not present

## 2021-06-02 ENCOUNTER — Emergency Department (HOSPITAL_COMMUNITY)
Admission: EM | Admit: 2021-06-02 | Discharge: 2021-06-02 | Disposition: A | Discharge status: Home / Self Care | Payer: Medicare PPO | Attending: Emergency Medicine | Admitting: Emergency Medicine

## 2021-06-02 ENCOUNTER — Ambulatory Visit (HOSPITAL_COMMUNITY)
Admission: EM | Admit: 2021-06-02 | Discharge: 2021-06-02 | Disposition: A | Discharge status: Home / Self Care | Payer: Medicare PPO | Attending: Family Medicine | Admitting: Family Medicine

## 2021-06-02 ENCOUNTER — Encounter (HOSPITAL_COMMUNITY): Payer: Self-pay

## 2021-06-02 ENCOUNTER — Encounter (HOSPITAL_COMMUNITY): Payer: Self-pay | Admitting: *Deleted

## 2021-06-02 ENCOUNTER — Other Ambulatory Visit: Payer: Self-pay

## 2021-06-02 DIAGNOSIS — K623 Rectal prolapse: Secondary | ICD-10-CM | POA: Insufficient documentation

## 2021-06-02 DIAGNOSIS — Z79899 Other long term (current) drug therapy: Secondary | ICD-10-CM | POA: Insufficient documentation

## 2021-06-02 DIAGNOSIS — I10 Essential (primary) hypertension: Secondary | ICD-10-CM | POA: Insufficient documentation

## 2021-06-02 DIAGNOSIS — K645 Perianal venous thrombosis: Secondary | ICD-10-CM | POA: Diagnosis not present

## 2021-06-02 DIAGNOSIS — K649 Unspecified hemorrhoids: Secondary | ICD-10-CM | POA: Diagnosis present

## 2021-06-02 LAB — CBC WITH DIFFERENTIAL/PLATELET
Abs Immature Granulocytes: 0.02 10*3/uL (ref 0.00–0.07)
Basophils Absolute: 0 10*3/uL (ref 0.0–0.1)
Basophils Relative: 0 %
Eosinophils Absolute: 0 10*3/uL (ref 0.0–0.5)
Eosinophils Relative: 0 %
HCT: 40.1 % (ref 36.0–46.0)
Hemoglobin: 13.6 g/dL (ref 12.0–15.0)
Immature Granulocytes: 0 %
Lymphocytes Relative: 8 %
Lymphs Abs: 0.7 10*3/uL (ref 0.7–4.0)
MCH: 33.3 pg (ref 26.0–34.0)
MCHC: 33.9 g/dL (ref 30.0–36.0)
MCV: 98.3 fL (ref 80.0–100.0)
Monocytes Absolute: 0.7 10*3/uL (ref 0.1–1.0)
Monocytes Relative: 8 %
Neutro Abs: 7.6 10*3/uL (ref 1.7–7.7)
Neutrophils Relative %: 84 %
Platelets: 225 10*3/uL (ref 150–400)
RBC: 4.08 MIL/uL (ref 3.87–5.11)
RDW: 12.9 % (ref 11.5–15.5)
WBC: 9.1 10*3/uL (ref 4.0–10.5)
nRBC: 0 % (ref 0.0–0.2)

## 2021-06-02 LAB — BASIC METABOLIC PANEL
Anion gap: 7 (ref 5–15)
BUN: 24 mg/dL — ABNORMAL HIGH (ref 8–23)
CO2: 26 mmol/L (ref 22–32)
Calcium: 9.1 mg/dL (ref 8.9–10.3)
Chloride: 106 mmol/L (ref 98–111)
Creatinine, Ser: 0.65 mg/dL (ref 0.44–1.00)
GFR, Estimated: >60 mL/min (ref >60)
Glucose, Bld: 99 mg/dL (ref 70–99)
Potassium: 3.8 mmol/L (ref 3.5–5.1)
Sodium: 139 mmol/L (ref 135–145)

## 2021-06-02 LAB — TYPE AND SCREEN
ABO/RH(D): O POS
Antibody Screen: NEGATIVE

## 2021-06-02 MED ORDER — SODIUM CHLORIDE 0.9 % IV SOLN: Freq: Once | INTRAVENOUS | Status: AC

## 2021-06-02 NOTE — ED Triage Notes (Signed)
Reports she has a bleeding Hemorrhoids . Pt reports the bleeding started today. ?

## 2021-06-02 NOTE — ED Notes (Signed)
Sugar reapplied to prolapsed rectum. ?

## 2021-06-02 NOTE — ED Provider Notes (Signed)
?[facility] DEPT ?Provider Note ? ? ?CSN: [id number] ?Arrival date & time: 06/02/21  1714 ? ?  ? ?History ? ?Chief Complaint  ?Patient presents with  ? Hemorrhoids  ? ? ?Sylvia Reynolds is a 71 y.o. female. ? ?HPI ?Patient reports that she has had small hemorrhoids off and on since having her children in 6.  She reports they have never been problematic.  Resolved on her own.  She reports today she had a couple soft bowel movements.  She did not feel like she was straining.  She reports that at 11 AM after bowel movement it seemed that she had a very large hemorrhoid and there was bleeding.  She reports is not terribly painful but has been itchy and feels really large.  She reports it has been blood draining and she has been putting on a pad to soak up drainage and blood.  Patient was seen at urgent care and referred to the emergency department for further evaluation.  Patient reports she is otherwise fairly healthy.  She has rheumatoid arthritis and takes methotrexate.  She has well-controlled hypertension on 2 blood pressure medications.  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 ? ?Patient last ate a peanut butter sandwich between 1 and 2 PM. ?  ? ?Home Medications ?Prior to Admission medications   ?Medication Sig Start Date End Date Taking? Authorizing Provider  ?Ascorbic Acid (VITAMIN C) 1000 MG tablet Take 1,000 mg by mouth daily.    [provider]  ?atenolol (TENORMIN) 25 MG tablet Take 25 mg by mouth daily.    [provider]  ?benazepril (LOTENSIN) 20 MG tablet Take 20 mg by mouth daily.    [provider]  ?calcium citrate-vitamin D (CITRACAL+D) 315-200 MG-UNIT tablet Take 2 tablets by mouth daily.    [provider]  ?folic acid (FOLVITE) 1 MG tablet Take 1 mg by mouth daily.    [provider]  ?loratadine (CLARITIN) 10 MG tablet Take 10 mg by mouth daily.    [provider]  ?Lysine 1000 MG TABS Take 1 tablet  by mouth daily.    [provider]  ?methotrexate 2.5 MG tablet Take 15 mg by mouth once a week.    [provider]  ?Multiple Vitamins-Minerals (MULTI COMPLETE/IRON PO) Take 1 tablet by mouth 2 (two) times daily.    [provider]  ?Omega-3 Fatty Acids (FISH OIL) 1200 MG CAPS Take 2 capsules by mouth daily.    [provider]  ?   ? ?Allergies    ?Zithromax [azithromycin], Ceftin [cefuroxime axetil], and Tape   ? ?Review of Systems   ?Review of Systems ?10 systems reviewed negative except as per HPI ?Physical Exam ?Updated Vital Signs ?BP (!) 154/100   Pulse 79   Temp 98.1 ?F (36.7 ?C) (Oral)   Resp 18   Ht 4' 11.5" (1.511 m)   Wt 54 kg   SpO2 99%   BMI 23.63 kg/m?  ?Physical Exam ?Constitutional:   ?   Appearance: Normal appearance.  ?HENT:  ?   Mouth/Throat:  ?   Pharynx: Oropharynx is clear.  ?Pulmonary:  ?   Effort: Pulmonary effort is normal.  ?Abdominal:  ?   General: There is no distension.  ?   Palpations: Abdomen is soft.  ?Genitourinary: ?   Comments: Large rectal prolapse see attached image ?Musculoskeletal:     ?   General: Normal range of motion.  ?Skin: ?  General: Skin is warm and dry.  ?Neurological:  ?   General: No focal deficit present.  ?   Mental Status: She is alert and oriented to person, place, and time.  ?   Coordination: Coordination normal.  ?Psychiatric:     ?   Mood and Affect: Mood normal.  ? ? ? ?ED Results / Procedures / Treatments   ?Labs ?(all labs ordered are listed, but only abnormal results are displayed) ?Labs Reviewed  ?BASIC METABOLIC PANEL - Abnormal; Notable for the following components:  ?    Result Value  ? BUN 24 (*)   ? All other components within normal limits  ?CBC WITH DIFFERENTIAL/PLATELET  ?TYPE AND SCREEN  ? ? ?EKG ?None ? ?Radiology ?No results found. ? ?Procedures ?Procedures  ? ? ?Medications Ordered in ED ?Medications  ?0.9 %  sodium chloride infusion ( Intravenous New Bag/Given 06/02/21 2013)  ? ? ?ED Course/ Medical  Decision Making/ A&P ?  ?                        ?Medical Decision Making ?Amount and/or Complexity of Data Reviewed ?Labs: ordered. ? ?Risk ?Prescription drug management. ? ? ?Comes from urgent care.  She has a rectal prolapse.  She has no prior history of prolapse.  This occurred today at 11 AM.  Patient is nontoxic.  She denies severe pain. ? ?Attempt made at manually reducing the prolapse.  Constant firm gentle pressure applied for about 5 minutes.  Prolapse remains firm and no significant reduction occurred.  Patient tolerated this well. ? ?Consult: General surgery Dr. Marlou Starks.  Advises he will come to the emergency department for reduction. ? ?Nursing staff applied sugar to the prolapse prior to Dr. Clint Lipps arrival.  Patient tolerated this well. ? ?Review of labs shows labs within normal limits.  No significant anemia, vital signs remained stable.  No sign of significant blood loss requiring any acute management. ? ?Patient is comfortable after prolapse reduced by Dr. Marlou Starks at bedside.  She is reassessed and ready for discharge.  Patient's vital signs are stable she is alert with clear mental status and appropriate for ongoing outpatient management.  Dr. Marlou Starks has reviewed follow-up plan of appointment with Dr. Annye English.  Patient also aware of the need to start twice daily Colace and stay hydrated to avoid constipation or straining. ? ? ? ? ? ? ? ?Final Clinical Impression(s) / ED Diagnoses ?Final diagnoses:  ?Rectal prolapse  ? ? ?Rx / DC Orders ?ED Discharge Orders   ? ? None  ? ?  ? ? ?  ?Charlesetta Shanks, MD ?06/02/21 2049 ? ?

## 2021-06-02 NOTE — ED Provider Notes (Signed)
?[facility] ? ? ? ?CSN: [id number] ?Arrival date & time: 06/02/21  1623 ? ? ?  ? ?History   ?Chief Complaint ?Chief Complaint  ?Patient presents with  ? Hemorrhoids  ? ? ?HPI ?Sylvia Reynolds is a 71 y.o. female.  ? ?Patient is here for bleeding hemorrhoids.  ?She has had hemorrhoids since the birth of her children.  Never really bothered her.  She has had bits of blood here and there.  ?For the last several months she has noted some swelling from time to time but resolves.  ?Today it is large and bleeding and cannot get it to stop.  Slight discomfort, slightly itching.  ? ?Past Medical History:  ?Diagnosis Date  ? Arthritis   ? Closed fracture dislocation of right elbow   ? Hypertension   ? Osteopenia   ? Rheumatoid arthritis ([place])   ? Scoliosis   ? Shingles   ? ? ?Patient Active Problem List  ? Diagnosis Date Noted  ? RA (rheumatoid arthritis) ([place]) 08/11/2018  ? Hypertension with normal renal function 08/11/2018  ? ? ?Past Surgical History:  ?Procedure Laterality Date  ? CESAREAN SECTION    ? ORIF ELBOW FRACTURE Right 07/01/2018  ? Procedure: OPEN REDUCTION INTERNAL FIXATION (ORIF) RIGHT ELBOW/OLECRANON FRACTURE;  Surgeon: Earlie Server, MD;  Location: [facility];  Service: Orthopedics;  Laterality: Right;  ? ? ?OB History   ? ? Gravida  ?1  ? Para  ?1  ? Term  ?   ? Preterm  ?1  ? AB  ?   ? Living  ?2  ?  ? ? SAB  ?   ? IAB  ?   ? Ectopic  ?   ? Multiple  ?1  ? Live Births  ?2  ?   ?  ?  ? ? ? ?Home Medications   ? ?Prior to Admission medications   ?Medication Sig Start Date End Date Taking? Authorizing Provider  ?Ascorbic Acid (VITAMIN C) 1000 MG tablet Take 1,000 mg by mouth daily.    [provider]  ?atenolol (TENORMIN) 25 MG tablet Take 25 mg by mouth daily.    [provider]  ?benazepril (LOTENSIN) 20 MG tablet Take 20 mg by mouth daily.    [provider]  ?calcium citrate-vitamin D (CITRACAL+D) 315-200 MG-UNIT tablet Take 2 tablets by mouth daily.     [provider]  ?folic acid (FOLVITE) 1 MG tablet Take 1 mg by mouth daily.    [provider]  ?loratadine (CLARITIN) 10 MG tablet Take 10 mg by mouth daily.    [provider]  ?Lysine 1000 MG TABS Take 1 tablet by mouth daily.    [provider]  ?methotrexate 2.5 MG tablet Take 15 mg by mouth once a week.    [provider]  ?Multiple Vitamins-Minerals (MULTI COMPLETE/IRON PO) Take 1 tablet by mouth 2 (two) times daily.    [provider]  ?Omega-3 Fatty Acids (FISH OIL) 1200 MG CAPS Take 2 capsules by mouth daily.    [provider]  ? ? ?Family History ?Family History  ?Problem Relation Age of Onset  ? Hypertension Mother   ? Parkinson's disease Mother   ? Cancer Father   ?     prostate, chronic lymph leukemia  ? Heart failure Father   ? Hypertension Father   ? Hypertension Sister   ? Cancer Brother   ?     CLL  ? Kidney  Stones Brother   ? Thyroid disease Maternal Grandfather   ? Breast cancer Maternal Aunt   ? Breast cancer Maternal Aunt   ? Breast cancer Cousin   ? Breast cancer Maternal Aunt   ? ? ?Social History ?Social History  ? ?Tobacco Use  ? Smoking status: Never  ? Smokeless tobacco: Never  ?Vaping Use  ? Vaping Use: Never used  ?Substance Use Topics  ? Alcohol use: Yes  ?  Comment: occasional  ? Drug use: No  ?  Comment: h/o marijuana use in college  ? ? ? ?Allergies   ?Zithromax [azithromycin], Ceftin [cefuroxime axetil], and Tape ? ? ?Review of Systems ?Review of Systems  ?Constitutional: Negative.   ?HENT: Negative.    ?Respiratory: Negative.    ?Cardiovascular: Negative.   ?Gastrointestinal: Negative.   ?Genitourinary: Negative.   ? ? ?Physical Exam ?Triage Vital Signs ?ED Triage Vitals  ?Enc Vitals Group  ?   BP 06/02/21 1637 (!) 150/76  ?   Pulse Rate 06/02/21 1637 81  ?   Resp 06/02/21 1637 18  ?   Temp 06/02/21 1637 98.6 ?F (37 ?C)  ?   Temp src --   ?   SpO2 06/02/21 1637 100 %  ?   Weight --   ?   Height --   ?   Head  Circumference --   ?   Peak Flow --   ?   Pain Score 06/02/21 1635 0  ?   Pain Loc --   ?   Pain Edu? --   ?   Excl. in [place]? --   ? ?No data found. ? ?Updated Vital Signs ?BP (!) 150/76   Pulse 81   Temp 98.6 ?F (37 ?C)   Resp 18   SpO2 100%  ? ?Visual Acuity ?Right Eye Distance:   ?Left Eye Distance:   ?Bilateral Distance:   ? ?Right Eye Near:   ?Left Eye Near:    ?Bilateral Near:    ? ?Physical Exam ?Constitutional:   ?   Appearance: Normal appearance.  ?Genitourinary: ?   Comments: At the rectum is a large, blood filled area (hemorrhoid?) just larger than a naval orange;  this is tender;  there is some bleeding/oozing present.  ?Neurological:  ?   Mental Status: She is alert.  ? ? ? ?UC Treatments / Results  ?Labs ?(all labs ordered are listed, but only abnormal results are displayed) ?Labs Reviewed - No data to display ? ?EKG ? ? ?Radiology ?No results found. ? ?Procedures ?Procedures (including critical care time) ? ?Medications Ordered in UC ?Medications - No data to display ? ?Initial Impression / Assessment and Plan / UC Course  ?I have reviewed the triage vital signs and the nursing notes. ? ?Pertinent labs & imaging results that were available during my care of the patient were reviewed by me and considered in my medical decision making (see chart for details). ? ?  ?Patient was seen today for possible bleeding hemorrhoid.  This ?hemorrhoid is quite large, slightly larger than a naval orange, with some bleeding present.  ?Given the size of this, I am concerned about drainage here in the urgent care as I could not control bleeding if needed.  She is aware and will go to the ER for further evaluation and treatment.  ? ?Final Clinical Impressions(s) / UC Diagnoses  ? ?Final diagnoses:  ?External hemorrhoid, thrombosed  ? ? ? ?Discharge Instructions   ? ?  ?You were seen  today for a large, bleeding area at your rectum.  ?Given the size, and the risk of bleeding, I recommend you go to the ER for evaluation  for this.  ? ? ? ?ED Prescriptions   ?None ?  ? ?PDMP not reviewed this encounter. ?  Rondel Oh, MD ?06/02/21 1656 ? ?

## 2021-06-02 NOTE — Discharge Instructions (Signed)
You were seen today for a large, bleeding area at your rectum.  ?Given the size, and the risk of bleeding, I recommend you go to the ER for evaluation for this.  ?

## 2021-06-02 NOTE — Consult Note (Signed)
Reason for Consult:rectal prolapse ?Referring Physician: Dr. Johnney Killian ? ?Sylvia Reynolds is an 71 y.o. female.  ?HPI: The patient is a 71 year old white female who presents after having a bowel movement earlier this morning.  She felt a lump after the bowel movement and assumed it was a hemorrhoid.  She put ice on it but it never went down.  She did develop some blood when wiping.  She then came to the emergency department where she was found to have a rectal prolapse.  She has no history of constipation.  Her last colonoscopy was in 2016 and was clean.  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 ? ?Past Medical History:  ?Diagnosis Date  ? Arthritis   ? Closed fracture dislocation of right elbow   ? Hypertension   ? Osteopenia   ? Rheumatoid arthritis ([place])   ? Scoliosis   ? Shingles   ? ? ?Past Surgical History:  ?Procedure Laterality Date  ? CESAREAN SECTION    ? ORIF ELBOW FRACTURE Right 07/01/2018  ? Procedure: OPEN REDUCTION INTERNAL FIXATION (ORIF) RIGHT ELBOW/OLECRANON FRACTURE;  Surgeon: Earlie Server, MD;  Location: [facility];  Service: Orthopedics;  Laterality: Right;  ? ? ?Family History  ?Problem Relation Age of Onset  ? Hypertension Mother   ? Parkinson's disease Mother   ? Cancer Father   ?     prostate, chronic lymph leukemia  ? Heart failure Father   ? Hypertension Father   ? Hypertension Sister   ? Cancer Brother   ?     CLL  ? Kidney Stones Brother   ? Thyroid disease Maternal Grandfather   ? Breast cancer Maternal Aunt   ? Breast cancer Maternal Aunt   ? Breast cancer Cousin   ? Breast cancer Maternal Aunt   ? ? ?Social History:  reports that she has never smoked. She has never used smokeless tobacco. She reports current alcohol use. She reports that she does not use drugs. ? ?Allergies:  ?Allergies  ?Allergen Reactions  ? Zithromax [Azithromycin] Diarrhea  ? Ceftin [Cefuroxime Axetil] Nausea Only  ? Tape Rash  ? ? ?Medications: I have  reviewed the patient's current medications. ? ?Results for orders placed or performed during the hospital encounter of 06/02/21 (from the past 48 hour(s))  ?Basic metabolic panel     Status: Abnormal  ? Collection Time: 06/02/21  7:13 PM  ?Result Value Ref Range  ? Sodium 139 135 - 145 mmol/L  ? Potassium 3.8 3.5 - 5.1 mmol/L  ? Chloride 106 98 - 111 mmol/L  ? CO2 26 22 - 32 mmol/L  ? Glucose, Bld 99 70 - 99 mg/dL  ?  Comment: Glucose reference range applies only to samples taken after fasting for at least 8 hours.  ? BUN 24 (H) 8 - 23 mg/dL  ? Creatinine, Ser 0.65 0.44 - 1.00 mg/dL  ? Calcium 9.1 8.9 - 10.3 mg/dL  ? GFR, Estimated >60 >60 mL/min  ?  Comment: (NOTE) ?Calculated using the CKD-EPI Creatinine Equation (2021) ?  ? Anion gap 7 5 - 15  ?  Comment: Performed at [facility], [place] [address], [place], [place] [address]  ?CBC with Differential     Status: None  ? Collection Time: 06/02/21  7:13 PM  ?Result Value Ref Range  ? WBC 9.1 4.0 - 10.5 K/uL  ? RBC 4.08 3.87 - 5.11 MIL/uL  ? Hemoglobin 13.6  12.0 - 15.0 g/dL  ? HCT 40.1 36.0 - 46.0 %  ? MCV 98.3 80.0 - 100.0 fL  ? MCH 33.3 26.0 - 34.0 pg  ? MCHC 33.9 30.0 - 36.0 g/dL  ? RDW 12.9 11.5 - 15.5 %  ? Platelets 225 150 - 400 K/uL  ? nRBC 0.0 0.0 - 0.2 %  ? Neutrophils Relative % 84 %  ? Neutro Abs 7.6 1.7 - 7.7 K/uL  ? Lymphocytes Relative 8 %  ? Lymphs Abs 0.7 0.7 - 4.0 K/uL  ? Monocytes Relative 8 %  ? Monocytes Absolute 0.7 0.1 - 1.0 K/uL  ? Eosinophils Relative 0 %  ? Eosinophils Absolute 0.0 0.0 - 0.5 K/uL  ? Basophils Relative 0 %  ? Basophils Absolute 0.0 0.0 - 0.1 K/uL  ? Immature Granulocytes 0 %  ? Abs Immature Granulocytes 0.02 0.00 - 0.07 K/uL  ?  Comment: Performed at [facility], [place] [address] [place], [place] [address]  ?Type and screen [place]     Status: None  ? Collection Time: 06/02/21  7:38 PM  ?Result Value Ref Range  ? ABO/RH(D) O POS   ? Antibody Screen NEG   ? Sample  Expiration    ?  06/05/2021,2359 ?Performed at [facility], [place] [address], [place], [place] [address] ?  ? ? ?No results found. ? ?Review of Systems  ?Constitutional: Negative.   ?HENT: Negative.    ?Eyes: Negative.   ?Respiratory: Negative.    ?Cardiovascular: Negative.   ?Gastrointestinal: Negative.   ?Endocrine: Negative.   ?Genitourinary: Negative.   ?Musculoskeletal: Negative.   ?Skin: Negative.   ?Allergic/Immunologic: Negative.   ?Neurological: Negative.   ?Hematological: Negative.   ?Psychiatric/Behavioral: Negative.    ?Blood pressure (!) 154/100, pulse 79, temperature 98.1 ?F (36.7 ?C), temperature source Oral, resp. rate 18, height 4' 11.5" (1.511 m), weight 54 kg, SpO2 99 %. ?Physical Exam ?Vitals reviewed.  ?Constitutional:   ?   General: She is not in acute distress. ?   Appearance: Normal appearance. She is normal weight.  ?HENT:  ?   Head: Normocephalic and atraumatic.  ?   Right Ear: External ear normal.  ?   Left Ear: External ear normal.  ?   Nose: Nose normal.  ?   Mouth/Throat:  ?   Mouth: Mucous membranes are moist.  ?   Pharynx: Oropharynx is clear.  ?Eyes:  ?   General: No scleral icterus. ?   Extraocular Movements: Extraocular movements intact.  ?   Conjunctiva/sclera: Conjunctivae normal.  ?   Pupils: Pupils are equal, round, and reactive to light.  ?Cardiovascular:  ?   Rate and Rhythm: Normal rate and regular rhythm.  ?   Pulses: Normal pulses.  ?   Heart sounds: Normal heart sounds.  ?Pulmonary:  ?   Effort: Pulmonary effort is normal. No respiratory distress.  ?   Breath sounds: Normal breath sounds.  ?Abdominal:  ?   General: Abdomen is flat.  ?   Palpations: Abdomen is soft.  ?   Tenderness: There is no abdominal tenderness.  ?Genitourinary: ?   Comments: There is rectal prolapse that was reduced manually ?Musculoskeletal:     ?   General: No swelling or deformity. Normal range of motion.  ?   Cervical back: Normal range of motion and neck supple. No tenderness.   ?Skin: ?   General: Skin is warm and dry.  ?   Coloration: Skin is  not jaundiced.  ?Neurological:  ?   General: No focal deficit present.  ?   Mental Status: She is alert and oriented to person, place, and time.  ?Psychiatric:     ?   Mood and Affect: Mood normal.     ?   Behavior: Behavior normal.     ?   Thought Content: Thought content normal.  ? ? ?Assessment/Plan: ?The patient has rectal prolapse that I was able to reduce manually.  She does not want to stay in the hospital because she has no one to care for her child.  I discussed her case with one of our colorectal specialist who felt that it would be safe for her to go home with close follow-up to one of our colorectal specialist in the office this week.  I will make those arrangements for her.  She should remain on a soft diet and start taking a stool softener until she sees our surgeons in the clinic.  She was instructed on how to reduce it herself at home should it pop back out.  If she is unable to reduce it then she understands that she may have to come back to the emergency department.  I gave her our contact information. ? ?Autumn Messing III ?06/02/2021, 8:54 PM  ? ? ? ? ?

## 2021-06-02 NOTE — ED Notes (Signed)
Sugar applied to prolapsed rectum. ?

## 2021-06-02 NOTE — Discharge Instructions (Addendum)
1.  Start taking over-the-counter Colace twice daily.  Try to stay well-hydrated.  Review instructions for rectal prolapse. ?2.  Call Dr. Orest Dikes office to schedule an appointment as soon as possible. ?3.  Return to the emergency department if you have recurrence of prolapse and cannot reduce it yourself with constant, gentle but firm manual pressure. ?

## 2021-06-02 NOTE — ED Triage Notes (Signed)
Patient reports that she began having bleeding from her hemorrhoids since 1100 today. Patient states she has been wearing a menstrual pad and has had to change hourly for a total of 6-7 pads today. ?Patient went to an UC and was told to come to the ED for further evaluation ?

## 2021-06-02 NOTE — ED Notes (Signed)
At bedside with Dr. Marlou Starks assisting with reduction of rectal prolapse. Reduction successful. Pt assisted in cleaning herself. ?

## 2021-06-05 DIAGNOSIS — F4323 Adjustment disorder with mixed anxiety and depressed mood: Secondary | ICD-10-CM | POA: Diagnosis not present

## 2021-06-13 DIAGNOSIS — K623 Rectal prolapse: Secondary | ICD-10-CM | POA: Diagnosis not present

## 2021-06-21 DIAGNOSIS — F4323 Adjustment disorder with mixed anxiety and depressed mood: Secondary | ICD-10-CM | POA: Diagnosis not present

## 2021-06-25 ENCOUNTER — Encounter: Payer: Self-pay | Admitting: Gastroenterology

## 2021-07-05 ENCOUNTER — Ambulatory Visit: Payer: Medicare PPO | Admitting: Physical Therapy

## 2021-07-09 ENCOUNTER — Ambulatory Visit: Payer: Medicare PPO | Attending: Surgery | Admitting: Physical Therapy

## 2021-07-09 ENCOUNTER — Encounter: Payer: Self-pay | Admitting: Physical Therapy

## 2021-07-09 DIAGNOSIS — M6281 Muscle weakness (generalized): Secondary | ICD-10-CM | POA: Insufficient documentation

## 2021-07-09 DIAGNOSIS — R279 Unspecified lack of coordination: Secondary | ICD-10-CM | POA: Diagnosis not present

## 2021-07-09 DIAGNOSIS — K623 Rectal prolapse: Secondary | ICD-10-CM | POA: Diagnosis not present

## 2021-07-09 DIAGNOSIS — M62838 Other muscle spasm: Secondary | ICD-10-CM | POA: Diagnosis not present

## 2021-07-09 DIAGNOSIS — R293 Abnormal posture: Secondary | ICD-10-CM | POA: Diagnosis not present

## 2021-07-09 NOTE — Therapy (Signed)
?OUTPATIENT PHYSICAL THERAPY FEMALE PELVIC EVALUATION ? ? ?Patient Name: Sylvia Reynolds ?MRN: [id number] ?DOB:[birth year], 71 y.o., female ?Today's Date: 07/09/2021 ? ? PT End of Session - 07/09/21 1026   ? ? Visit Number 1   ? Date for PT Re-Evaluation 10/09/21   ? Authorization Type [employer]   ? Progress Note Due on Visit 10   ? PT Start Time 1016   ? PT Stop Time 1055   ? PT Time Calculation (min) 39 min   ? Activity Tolerance Patient tolerated treatment well   ? Behavior During Therapy [facility] for tasks assessed/performed   ? ?  ?  ? ?  ? ? ?Past Medical History:  ?Diagnosis Date  ? Arthritis   ? Closed fracture dislocation of right elbow   ? Hypertension   ? Osteopenia   ? Rheumatoid arthritis ([place])   ? Scoliosis   ? Shingles   ? ?Past Surgical History:  ?Procedure Laterality Date  ? CESAREAN SECTION    ? ORIF ELBOW FRACTURE Right 07/01/2018  ? Procedure: OPEN REDUCTION INTERNAL FIXATION (ORIF) RIGHT ELBOW/OLECRANON FRACTURE;  Surgeon: Earlie Server, MD;  Location: [facility];  Service: Orthopedics;  Laterality: Right;  ? ?Patient Active Problem List  ? Diagnosis Date Noted  ? RA (rheumatoid arthritis) ([place]) 08/11/2018  ? Hypertension with normal renal function 08/11/2018  ? ? ?PCP: Merrilee Seashore, MD ? ?REFERRING PROVIDER: Ileana Roup, MD ? ?REFERRING DIAG: K62.3 (ICD-10-CM) - Rectal prolapse ? ?THERAPY DIAG:  ?Muscle weakness (generalized) ? ?Unspecified lack of coordination ? ?Other muscle spasm ? ?Abnormal posture ? ?ONSET DATE: 06/02/21 ? ?SUBJECTIVE:                                                                                                                                                                                          ? ?SUBJECTIVE STATEMENT: ?Pt went to ED thinking she had an external hemorrhoid but was having a lot of bleeding and they were able to return prolapse during that visit. Pt states she feels like it has staying internal and does feel regularly to make  sure. Does take mirilax to help with BMs. Pt does take care of adult daughter with special needs and does need physical assistance but pt does not always need to lift.  ?   ?Fluid intake: Yes: 3-4 large glasses of water, coffee and hot tea but not regularly    ? ?Patient confirms identification and approves PT to assess pelvic floor and treatment Yes ? ? ?PAIN:  ?Are you having pain? No not at pelvis but does have RA and will have pain from this.  ? ? ?  PRECAUTIONS: None ? ?WEIGHT BEARING RESTRICTIONS No ? ?FALLS:  ?Has patient fallen in last 6 months? No ? ?LIVING ENVIRONMENT: ?Lives with: lives with their family ?Lives in: House/apartment ?Stairs: Yes ? 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 ?PLOF: Independent ? ?PATIENT GOALS to prevent worsening of prolapse ? ?PERTINENT HISTORY:  ?HTN, scoilosis, RA, osteopenia ?Sexual abuse: No ? ?BOWEL MOVEMENT ?Pain with bowel movement: No ?Type of bowel movement:Type (Bristol Stool Scale) 4, Frequency at least daily, and Strain No was previously but no longer  ?Fully empty rectum: Yes:   ?Leakage: No ?Pads: No ?Fiber supplement: Yes: Does take  ? ?URINATION ?Pain with urination: No ?Fully empty bladder: Yes:   ?Stream: Strong ?Urgency: Yes: only with waiting longer than a few hours ?Frequency: at least every 2-3 hours ?Leakage: Walking to the bathroom not getting there quickly enough ?Pads: Yes: one per day ? ?INTERCOURSE ?Pain with intercourse:  not active , not painful when she was active but spouse had prostate CA 12 years ago and has not had intercourse ? ?PREGNANCY ?Vaginal deliveries 1(twins with one vaginal and one csection) ?Tearing Yes: with forceps use and episiotomy  ?C-section deliveries 1 ?Currently pregnant No ? ?PROLAPSE ?Rectocele per chart ? ? ? ?OBJECTIVE:  ? ?DIAGNOSTIC FINDINGS:  ? ? ?COGNITION: ? Overall cognitive status: Within functional limits for tasks assessed   ?  ?SENSATION: ? Light touch: Appears  intact ? Proprioception: Appears intact ? ?MUSCLE LENGTH: ?Bil hamstrings and adductors limited by 25% ? ? ?GAIT: ?WFL but slight decreased Lt LE step length ? ?POSTURE:  ? Pt does have scoliosis and with assessment, Lt ribs posteriorly rotated with visual rib hump present in lower back.   ? ?LUMBARAROM/PROM ? ?A/PROM A/PROM  ?07/09/2021  ?Flexion Limited by 25%   ?Extension Limited by 50%  ?Right lateral flexion Limited by 25%  ?Left lateral flexion Limited by 50%  ?Right rotation Limited by 25%  ?Left rotation Limited by 25%  ? (Blank rows = not tested) ? ?LE ROM: ? ?WFL ? ?LE MMT: ? ?Bil hips 4/5; knees and ankles 5/5 ? ?PELVIC MMT: ?  ?MMT  ?07/09/2021  ?Vaginal   ?Internal Anal Sphincter 3/5  ?External Anal Sphincter 3/5  ?Puborectalis 3/5  ?Diastasis Recti   ?(Blank rows = not tested) ? ?      PALPATION: ?  General  no TTP  ? ?              External Perineal Exam no TTP does have excess skin present at external anal area.  ?              ?              Internal Pelvic Floor no TTP ? ?TONE: ?Mildly decreased ? ?PROLAPSE: ?Not seen rectally ? ?TODAY'S TREATMENT  ?07/09/2021 EVAL Examination completed, findings reviewed, pt educated on POC, HEP, and voiding mechanics. Pt motivated to participate in PT and agreeable to attempt recommendations.   ? ? ?PATIENT EDUCATION:  ?Education details: [id number] ?Person educated: Patient ?Education method: Explanation, Demonstration, Tactile cues, Verbal cues, and Handouts ?Education comprehension: verbalized understanding and returned demonstration ? ? ?HOME EXERCISE PROGRAM: ?[id number] ? ?ASSESSMENT: ? ?CLINICAL IMPRESSION: ?Patient is a 71 y.o. female  who was seen today for physical therapy evaluation and treatment for rectal prolapse. See above for details but pt found to have posture deficits due to scoliosis, mild bil hip weakness, decreased hip and spinal flexibility, and  pt did consent to rectal assessment this date. Found to have weakness, decreased endurance and  coordination. Pt also has limited rib expansion due to scoliosis. Pt would benefit from additional PT to further address deficits.  ? ? ?OBJECTIVE IMPAIRMENTS decreased coordination, decreased endurance, decreased mobility, decreased strength, increased fascial restrictions, impaired flexibility, improper body mechanics, and postural dysfunction.  ? ?ACTIVITY LIMITATIONS community activity and caregiver for DTR .  ? ?PERSONAL FACTORS Fitness, Time since onset of injury/illness/exacerbation, and 1 comorbidity: medical history  are also affecting patient's functional outcome.  ? ? ?REHAB POTENTIAL: Good ? ?CLINICAL DECISION MAKING: Stable/uncomplicated ? ?EVALUATION COMPLEXITY: Low ? ? ?GOALS: ?Goals reviewed with patient? Yes ? ?SHORT TERM GOALS: Target date: 08/06/2021 ? ?Pt to be I with HEP.  ?Baseline: ?Goal status: INITIAL ? ?2.  Pt to demonstrate improved coordination of pelvic floor and breathing with body weight squat without compensatory strategies to decrease strain at pelvic floor and prolapse.   ?Baseline:  ?Goal status: INITIAL ? ?3.  Pt to demonstrate 4/5 pelvic floor strength for improved pelvic stability ?Baseline:  ?Goal status: INITIAL ? ? ? ?[place] TERM GOALS: Target date: 10/09/2021 ? ?Pt to be I with advanced HEP.   ?Baseline:  ?Goal status: INITIAL ? ?2.  Pt to demonstrate improved coordination of pelvic floor and breathing with 15# squat without compensatory strategies to decrease strain at pelvic floor and prolapse.   ?Baseline:  ?Goal status: INITIAL ? ?3.    Pt to demonstrate 5/5 pelvic floor strength for improved pelvic stability ?Baseline:  ?Goal status: INITIAL ? ?4.  Pt to demonstrate at least 5/5 bil hip strength for improved pelvic stability .  ?Baseline:  ?Goal status: INITIAL ? ? ?PLAN: ?PT FREQUENCY: 1x/week ? ?PT DURATION:  6 sessions ? ?PLANNED INTERVENTIONS: Therapeutic exercises, Therapeutic activity, Neuromuscular re-education, Patient/Family education, Joint mobilization, Spinal  mobilization, Cryotherapy, Moist heat, Manual lymph drainage, scar mobilization, Taping, Biofeedback, and Manual therapy ? ?PLAN FOR NEXT SESSION: go over breathing and voiding mechanics, coordination of pelvic floor with tasks  ? ? ?Hal

## 2021-07-10 DIAGNOSIS — M0589 Other rheumatoid arthritis with rheumatoid factor of multiple sites: Secondary | ICD-10-CM | POA: Diagnosis not present

## 2021-07-11 ENCOUNTER — Ambulatory Visit: Payer: Medicare PPO | Admitting: Gastroenterology

## 2021-07-11 ENCOUNTER — Encounter: Payer: Self-pay | Admitting: Gastroenterology

## 2021-07-11 VITALS — BP 138/82 | HR 75 | Ht 59.0 in | Wt 119.8 lb

## 2021-07-11 DIAGNOSIS — K219 Gastro-esophageal reflux disease without esophagitis: Secondary | ICD-10-CM

## 2021-07-11 DIAGNOSIS — K623 Rectal prolapse: Secondary | ICD-10-CM

## 2021-07-11 DIAGNOSIS — Z1211 Encounter for screening for malignant neoplasm of colon: Secondary | ICD-10-CM | POA: Diagnosis not present

## 2021-07-11 MED ORDER — NA SULFATE-K SULFATE-MG SULF 17.5-3.13-1.6 GM/177ML PO SOLN: 1.0000 | Freq: Once | ORAL | 0 refills | Status: AC

## 2021-07-11 NOTE — Progress Notes (Addendum)
07/11/2021 Sylvia Reynolds [id number] Jan 30, 1951   HISTORY OF PRESENT ILLNESS: This is a 71 year old female who is new to our office.  She has actually been referred here by Dr. Dema Severin with [facility] surgery to discuss colonoscopy.  She recently was seen in the emergency department by the surgical team for rectal prolapse.  Rectal prolapse was reduced, but and she was told to follow-up with surgery in the office.  She saw Dr. Dema Severin and he referred her to pelvic floor physical therapy.  So far she has had one session.  He also recommended another colonoscopy.  She tells me that she had one in 2006 that was incomplete to the hepatic flexure on due to redundant colon.  She had a follow-up barium enema that was fine.  She then had another one several years later by Dr. Amedeo Plenty at [place] that she does not have a record of.  She was seeing rectal bleeding what they thought was likely from the prolapse.  She has been using MiraLAX regularly and that has helped with her constipation issues.  Also reports intermittent reflux issues.  Never had an EGD in the past.  Is not currently on medication.  She would like to have an EGD as well.  No dysphagia.   Past Medical History:  Diagnosis Date   Arthritis    Closed fracture dislocation of right elbow    GERD (gastroesophageal reflux disease)    Hypertension    Osteopenia    Rheumatoid arthritis ([place])    Scoliosis    Shingles    UTI (urinary tract infection)    Past Surgical History:  Procedure Laterality Date   CESAREAN SECTION     ORIF ELBOW FRACTURE Right 07/01/2018   Procedure: OPEN REDUCTION INTERNAL FIXATION (ORIF) RIGHT ELBOW/OLECRANON FRACTURE;  Surgeon: Earlie Server, MD;  Location: [facility];  Service: Orthopedics;  Laterality: Right;    reports that she has never smoked. She has never used smokeless tobacco. She reports current alcohol use. She reports that she does not use drugs. family history includes Breast cancer  in her cousin, maternal aunt, maternal aunt, and maternal aunt; Cancer in her brother and father; Colon cancer in an other family member; Heart failure in her father; Hypertension in her father, mother, and sister; Kidney Stones in her brother; Parkinson's disease in her mother; Thyroid disease in her maternal grandfather. Allergies  Allergen Reactions   Zithromax [Azithromycin] Diarrhea   Ceftin [Cefuroxime Axetil] Nausea Only   Tape Rash      Outpatient Encounter Medications as of 07/11/2021  Medication Sig   Ascorbic Acid (VITAMIN C) 1000 MG tablet Take 1,000 mg by mouth daily.   atenolol (TENORMIN) 25 MG tablet Take 25 mg by mouth daily.   benazepril (LOTENSIN) 20 MG tablet Take 20 mg by mouth daily.   calcium citrate-vitamin D (CITRACAL+D) 315-200 MG-UNIT tablet Take 2 tablets by mouth daily.   folic acid (FOLVITE) 1 MG tablet Take 1 mg by mouth daily.   loratadine (CLARITIN) 10 MG tablet Take 10 mg by mouth daily.   Lysine 1000 MG TABS Take 1 tablet by mouth daily.   methotrexate 2.5 MG tablet Take 15 mg by mouth once a week.   Multiple Vitamins-Minerals (MULTI COMPLETE/IRON PO) Take 1 tablet by mouth 2 (two) times daily.   Omega-3 Fatty Acids (FISH OIL) 1200 MG CAPS Take 2 capsules by mouth daily.   polyethylene glycol powder (GLYCOLAX/MIRALAX) 17 GM/SCOOP powder  No facility-administered encounter medications on file as of 07/11/2021.     REVIEW OF SYSTEMS  : All other systems reviewed and negative except where noted in the History of Present Illness.   PHYSICAL EXAM: BP 138/82   Pulse 75   Ht '4\' 11"'$  (1.499 m)   Wt 119 lb 12.8 oz (54.3 kg)   SpO2 99%   BMI 24.20 kg/m  General: Well developed white female in no acute distress Head: Normocephalic and atraumatic Eyes:  Sclerae anicteric, conjunctiva pink. Ears: Normal auditory acuity Lungs: Clear throughout to auscultation; no W/R/R. Heart: Regular rate and rhythm; no M/R/G. Abdomen: Soft, non-distended.  BS present.   Non-tender. Rectal:  Will distended.  BS present.  Non-tender. Musculoskeletal: Symmetrical with no gross deformities  Skin: No lesions on visible extremities Extremities: No edema  Neurological: Alert oriented x 4, grossly non-focal Psychological:  Alert and cooperative. Normal mood and affect  ASSESSMENT AND PLAN: *CRC screening:  Last colonoscopy several years ago at [facility].  We will request the results of that.  In 2006 she had a colonoscopy elsewhere that was incomplete to the hepatic flexure due to redundant colon.  Follow-up barium enema was okay.  Then she had another one since then by [facility].  She is recently being treated for rectal prolapse by Dr. Dema Severin and has having pelvic floor physical therapy.  He recommended colonoscopy.  We will schedule Dr. Silverio Decamp for attempt at that.   *GERD: Reports intermittent reflux issues.  Never had an EGD in the past.  Is not currently on medication.  She would like to have an EGD as well.  We will also schedule that.  **The risks, benefits, and alternatives were discussed with the patient and they consent to proceed.   **Addendum: Records received from her previous colonoscopy with Dr. Amedeo Plenty in September 2016.  At that time the exam was normal with limited view of the cecum due to extreme tortuosity.  He recommended a repeat in 10 years.  This is being sent for scanning.   CC:  Merrilee Seashore, MD CC:  Dr. Nadeen Landau

## 2021-07-11 NOTE — Patient Instructions (Signed)
You have been scheduled for a colonoscopy. Please follow written instructions given to you at your visit today.  ?Please pick up your prep supplies at the pharmacy within the next 1-3 days. ?If you use inhalers (even only as needed), please bring them with you on the day of your procedure. ? ?If you are age 69 or older, your body mass index should be between 23-30. Your Body mass index is 24.2 kg/m?Marland Kitchen If this is out of the aforementioned range listed, please consider follow up with your Primary Care Provider. ?________________________________________________________ ? ?The [facility] providers would like to encourage you to use [facility] to communicate with providers for non-urgent requests or questions.  Due to [facility] hold times on the telephone, sending your provider a message by [facility] may be a faster and more efficient way to get a response.  Please allow 48 business hours for a response.  Please remember that this is for non-urgent requests.  ?_______________________________________________________ ? ?Thank you for entrusting me with your care and for choosing [employer], ? ?Alonza Bogus, P.A. - C. ? ?

## 2021-07-17 ENCOUNTER — Ambulatory Visit: Payer: Medicare PPO | Admitting: Physical Therapy

## 2021-07-17 DIAGNOSIS — K623 Rectal prolapse: Secondary | ICD-10-CM | POA: Diagnosis not present

## 2021-07-17 DIAGNOSIS — F4323 Adjustment disorder with mixed anxiety and depressed mood: Secondary | ICD-10-CM | POA: Diagnosis not present

## 2021-07-17 DIAGNOSIS — M6281 Muscle weakness (generalized): Secondary | ICD-10-CM | POA: Diagnosis not present

## 2021-07-17 DIAGNOSIS — M62838 Other muscle spasm: Secondary | ICD-10-CM

## 2021-07-17 DIAGNOSIS — R293 Abnormal posture: Secondary | ICD-10-CM

## 2021-07-17 DIAGNOSIS — R279 Unspecified lack of coordination: Secondary | ICD-10-CM

## 2021-07-17 NOTE — Therapy (Signed)
?OUTPATIENT PHYSICAL THERAPY TREATMENT NOTE ? ? ?Patient Name: Sylvia Reynolds ?MRN: [id number] ?DOB:[birth year], 71 y.o., female ?Today's Date: 07/17/2021 ? ?PCP: Merrilee Seashore, MD ?REFERRING PROVIDER: Ileana Roup, MD ? ?END OF SESSION:  ? PT End of Session - 07/17/21 1616   ? ? Visit Number 2   ? Date for PT Re-Evaluation 10/09/21   ? Authorization Type [employer]   ? Progress Note Due on Visit 10   ? PT Start Time 1615   ? PT Stop Time 0626   ? PT Time Calculation (min) 41 min   ? Activity Tolerance Patient tolerated treatment well   ? Behavior During Therapy [facility] for tasks assessed/performed   ? ?  ?  ? ?  ? ? ?Past Medical History:  ?Diagnosis Date  ? Arthritis   ? Closed fracture dislocation of right elbow   ? GERD (gastroesophageal reflux disease)   ? Hypertension   ? Osteopenia   ? Rheumatoid arthritis ([place])   ? Scoliosis   ? Shingles   ? UTI (urinary tract infection)   ? ?Past Surgical History:  ?Procedure Laterality Date  ? CESAREAN SECTION    ? ORIF ELBOW FRACTURE Right 07/01/2018  ? Procedure: OPEN REDUCTION INTERNAL FIXATION (ORIF) RIGHT ELBOW/OLECRANON FRACTURE;  Surgeon: Earlie Server, MD;  Location: [facility];  Service: Orthopedics;  Laterality: Right;  ? ?Patient Active Problem List  ? Diagnosis Date Noted  ? Gastroesophageal reflux disease 07/11/2021  ? Rectal prolapse 07/11/2021  ? Screening for colon cancer 07/11/2021  ? RA (rheumatoid arthritis) ([place]) 08/11/2018  ? Hypertension with normal renal function 08/11/2018  ? ? ?REFERRING DIAG: K62.3 (ICD-10-CM) - Rectal prolapse ?  ? ?THERAPY DIAG:  ?Muscle weakness (generalized) ? ?Unspecified lack of coordination ? ?Other muscle spasm ? ?Abnormal posture ? ?PERTINENT HISTORY: HTN, scoilosis, RA, osteopenia ?Sexual abuse: No ? ?PRECAUTIONS: None ? ?SUBJECTIVE: Pt report she has not been able do HEP yet but has read over it and plans to do it more.  ? ?PAIN:  ?Are you having pain? No ? ? ?OBJECTIVE: (objective measures  completed at initial evaluation unless otherwise dated) ? ? ?DIAGNOSTIC FINDINGS:  ?  ?  ?COGNITION: ?           Overall cognitive status: Within functional limits for tasks assessed              ?            ?SENSATION: ?           Light touch: Appears intact ?           Proprioception: Appears intact ?  ?MUSCLE LENGTH: ?Bil hamstrings and adductors limited by 25% ?  ?  ?GAIT: ?WFL but slight decreased Lt LE step length ?  ?POSTURE:  ? Pt does have scoliosis and with assessment, Lt ribs posteriorly rotated with visual rib hump present in lower back.   ?  ?LUMBARAROM/PROM ?  ?A/PROM A/PROM  ?07/09/2021  ?Flexion Limited by 25%   ?Extension Limited by 50%  ?Right lateral flexion Limited by 25%  ?Left lateral flexion Limited by 50%  ?Right rotation Limited by 25%  ?Left rotation Limited by 25%  ? (Blank rows = not tested) ?  ?LE ROM: ?  ?WFL ?  ?LE MMT: ?  ?Bil hips 4/5; knees and ankles 5/5 ?  ?PELVIC MMT: ?  ?MMT   ?07/09/2021  ?Vaginal    ?Internal Anal Sphincter 3/5  ?External Anal Sphincter 3/5  ?  Puborectalis 3/5  ?Diastasis Recti    ?(Blank rows = not tested) ?  ?      PALPATION: ?  General  no TTP  ?  ?              External Perineal Exam no TTP does have excess skin present at external anal area.  ?              ?              Internal Pelvic Floor no TTP ?  ?TONE: ?Mildly decreased ?  ?PROLAPSE: ?Not seen rectally ?  ?TODAY'S TREATMENT  ? ?07/17/2021: ?Pt deferred vaginal assessment but agreeable next session to better assess prolapse.  ?2x10  bridges  ?2x10 pelvic floor contractions in hooklying  ?4U98 quick flicks in hooklying  ?10x5s holds with pelvic floor contractions in hooklying  ?2x10 transverse abdominus contractions in hooklying  ?2x10 opp hand/knee ball press ?Dead bugs 2x10 each ?3x10 Sit to stand from mat table ?2x10 diaphragmatic breathing with ball press ? ?07/09/2021 EVAL Examination completed, findings reviewed, pt educated on POC, HEP, and voiding mechanics. Pt motivated to participate in PT and  agreeable to attempt recommendations.   ?  ?  ?PATIENT EDUCATION:  ?Education details: [id number] ?Person educated: Patient ?Education method: Explanation, Demonstration, Tactile cues, Verbal cues, and Handouts ?Education comprehension: verbalized understanding and returned demonstration ?  ?  ?HOME EXERCISE PROGRAM: ?[id number] ?  ?ASSESSMENT: ?  ?CLINICAL IMPRESSION: ?Patient  session focused on hip and core strengthening with coordination of pelvic floor and breathing mechanics throughout with pt needing max VC to complete without increased abdominal pressure and abdominal bulging. Pt would benefit from additional PT to further address deficits.  ?  ?  ?OBJECTIVE IMPAIRMENTS decreased coordination, decreased endurance, decreased mobility, decreased strength, increased fascial restrictions, impaired flexibility, improper body mechanics, and postural dysfunction.  ?  ?ACTIVITY LIMITATIONS community activity and caregiver for DTR .  ?  ?PERSONAL FACTORS Fitness, Time since onset of injury/illness/exacerbation, and 1 comorbidity: medical history  are also affecting patient's functional outcome.  ?  ?  ?REHAB POTENTIAL: Good ?  ?CLINICAL DECISION MAKING: Stable/uncomplicated ?  ?EVALUATION COMPLEXITY: Low ?  ?  ?GOALS: ?Goals reviewed with patient? Yes ?  ?SHORT TERM GOALS: Target date: 08/06/2021 ?  ?Pt to be I with HEP.  ?Baseline: ?Goal status: INITIAL ?  ?2.  Pt to demonstrate improved coordination of pelvic floor and breathing with body weight squat without compensatory strategies to decrease strain at pelvic floor and prolapse.   ?Baseline:  ?Goal status: INITIAL ?  ?3.  Pt to demonstrate 4/5 pelvic floor strength for improved pelvic stability ?Baseline:  ?Goal status: INITIAL ?  ?  ?  ?[place] TERM GOALS: Target date: 10/09/2021 ?  ?Pt to be I with advanced HEP.   ?Baseline:  ?Goal status: INITIAL ?  ?2.  Pt to demonstrate improved coordination of pelvic floor and breathing with 15# squat without compensatory strategies  to decrease strain at pelvic floor and prolapse.   ?Baseline:  ?Goal status: INITIAL ?  ?3.    Pt to demonstrate 5/5 pelvic floor strength for improved pelvic stability ?Baseline:  ?Goal status: INITIAL ?  ?4.  Pt to demonstrate at least 5/5 bil hip strength for improved pelvic stability .  ?Baseline:  ?Goal status: INITIAL ?  ?  ?PLAN: ?PT FREQUENCY: 1x/week ?  ?PT DURATION:  6 sessions ?  ?PLANNED INTERVENTIONS: Therapeutic exercises, Therapeutic activity, Neuromuscular re-education, Patient/Family education, Joint  mobilization, Spinal mobilization, Cryotherapy, Moist heat, Manual lymph drainage, scar mobilization, Taping, Biofeedback, and Manual therapy ?  ?PLAN FOR NEXT SESSION: coordination of pelvic floor with tasks  ? ? ?Stacy Gardner, PT, DPT ?05/17/234:58 PM  ? ?  ? ?

## 2021-07-18 ENCOUNTER — Encounter: Payer: Medicare PPO | Admitting: Physical Therapy

## 2021-07-25 NOTE — Progress Notes (Signed)
Reviewed and agree with documentation and assessment and plan. K. Veena Tarek Cravens , MD   

## 2021-08-01 ENCOUNTER — Ambulatory Visit: Payer: Medicare PPO | Admitting: Physical Therapy

## 2021-08-06 ENCOUNTER — Encounter: Payer: Medicare PPO | Admitting: Physical Therapy

## 2021-08-13 ENCOUNTER — Encounter: Payer: Medicare PPO | Admitting: Physical Therapy

## 2021-08-14 ENCOUNTER — Ambulatory Visit: Payer: Medicare PPO | Attending: Surgery | Admitting: Physical Therapy

## 2021-08-14 DIAGNOSIS — M6281 Muscle weakness (generalized): Secondary | ICD-10-CM | POA: Insufficient documentation

## 2021-08-14 DIAGNOSIS — R293 Abnormal posture: Secondary | ICD-10-CM | POA: Diagnosis not present

## 2021-08-14 DIAGNOSIS — R279 Unspecified lack of coordination: Secondary | ICD-10-CM | POA: Insufficient documentation

## 2021-08-14 DIAGNOSIS — F4323 Adjustment disorder with mixed anxiety and depressed mood: Secondary | ICD-10-CM | POA: Diagnosis not present

## 2021-08-14 DIAGNOSIS — M62838 Other muscle spasm: Secondary | ICD-10-CM | POA: Diagnosis not present

## 2021-08-14 NOTE — Therapy (Signed)
OUTPATIENT PHYSICAL THERAPY TREATMENT NOTE   Patient Name: Sylvia Reynolds MRN: [id number] DOB:1950-03-18, 71 y.o., female Today's Date: 08/14/2021  PCP: Merrilee Seashore, MD REFERRING PROVIDER: Ileana Roup, MD  END OF SESSION:   PT End of Session - 08/14/21 1404     Visit Number 3    Date for PT Re-Evaluation 10/09/21    Authorization Type [employer]    Progress Note Due on Visit 10    PT Start Time 1403    PT Stop Time 1444    PT Time Calculation (min) 41 min    Activity Tolerance Patient tolerated treatment well    Behavior During Therapy WFL for tasks assessed/performed             Past Medical History:  Diagnosis Date   Arthritis    Closed fracture dislocation of right elbow    GERD (gastroesophageal reflux disease)    Hypertension    Osteopenia    Rheumatoid arthritis ([facility])    Scoliosis    Shingles    UTI (urinary tract infection)    Past Surgical History:  Procedure Laterality Date   CESAREAN SECTION     ORIF ELBOW FRACTURE Right 07/01/2018   Procedure: OPEN REDUCTION INTERNAL FIXATION (ORIF) RIGHT ELBOW/OLECRANON FRACTURE;  Surgeon: Earlie Server, MD;  Location: [place];  Service: Orthopedics;  Laterality: Right;   Patient Active Problem List   Diagnosis Date Noted   Gastroesophageal reflux disease 07/11/2021   Rectal prolapse 07/11/2021   Screening for colon cancer 07/11/2021   RA (rheumatoid arthritis) ([place]) 08/11/2018   Hypertension with normal renal function 08/11/2018    REFERRING DIAG: K62.3 (ICD-10-CM) - Rectal prolapse    THERAPY DIAG:  Muscle weakness (generalized)  Unspecified lack of coordination  Other muscle spasm  Abnormal posture  PERTINENT HISTORY: HTN, scoilosis, RA, osteopenia Sexual abuse: No  PRECAUTIONS: None  SUBJECTIVE: Pt reports she has been sick for the past few weeks and has not been able to do HEP very much, has been doing Kegels. Pt has not had any leakage of stool at all and  denies all prolapse symptoms. Pt has ordered squatty potty and has been implementing exhale with lifting mechanics.   PAIN:  Are you having pain? No   OBJECTIVE: (objective measures completed at initial evaluation unless otherwise dated)   DIAGNOSTIC FINDINGS:      COGNITION:            Overall cognitive status: Within functional limits for tasks assessed                          SENSATION:            Light touch: Appears intact            Proprioception: Appears intact   MUSCLE LENGTH: Bil hamstrings and adductors limited by 25%     GAIT: WFL but slight decreased Lt LE step length   POSTURE:   Pt does have scoliosis and with assessment, Lt ribs posteriorly rotated with visual rib hump present in lower back.     LUMBARAROM/PROM   A/PROM A/PROM  07/09/2021  Flexion Limited by 25%   Extension Limited by 50%  Right lateral flexion Limited by 25%  Left lateral flexion Limited by 50%  Right rotation Limited by 25%  Left rotation Limited by 25%   (Blank rows = not tested)   LE ROM:   WFL   LE MMT:  Bil hips 4/5; knees and ankles 5/5   PELVIC MMT:   MMT   07/09/2021  Vaginal    Internal Anal Sphincter 3/5  External Anal Sphincter 3/5  Puborectalis 3/5  Diastasis Recti    (Blank rows = not tested)         PALPATION:   General  no TTP                  External Perineal Exam no TTP does have excess skin present at external anal area.                              Internal Pelvic Floor no TTP   TONE: Mildly decreased   PROLAPSE: Not seen rectally   TODAY'S TREATMENT   08/14/2021: 2x10  bridges  2x10 opp hand/knee ball press Pelvic tilts x20 in standing (unable to complete in hooklying or sitting improved with standing and ball at low back for feedback)  Quad alternating knee extension 2x10 Sit to stand from mat table 8# 2x10 Adductor squeezes in sitting 2x10 with exhale Freida Busman punches 3# x10 each Seated bicep curls 2x10 3#  Farmer's Carry 500' 8# and  3#  07/17/2021: Pt deferred vaginal assessment but agreeable next session to better assess prolapse.  2x10  bridges  2x10 pelvic floor contractions in hooklying  0A54 quick flicks in hooklying  09W1X holds with pelvic floor contractions in hooklying  2x10 transverse abdominus contractions in hooklying  2x10 opp hand/knee ball press Dead bugs 2x10 each 3x10 Sit to stand from mat table 2x10 diaphragmatic breathing with ball press  07/09/2021 EVAL Examination completed, findings reviewed, pt educated on POC, HEP, and voiding mechanics. Pt motivated to participate in PT and agreeable to attempt recommendations.       PATIENT EDUCATION:  Education details: [id number] Person educated: Patient Education method: Explanation, Demonstration, Tactile cues, Verbal cues, and Handouts Education comprehension: verbalized understanding and returned demonstration     HOME EXERCISE PROGRAM: [id number]   ASSESSMENT:   CLINICAL IMPRESSION: Patient  session focused on hip and core strengthening with coordination of pelvic floor and breathing mechanics throughout with all exercises during treatment, demonstrated improved coordination with breathing and core activations and reports she felt able to contract pelvic floor when needed during exercise as well. Pt would benefit from additional PT to further address deficits.      OBJECTIVE IMPAIRMENTS decreased coordination, decreased endurance, decreased mobility, decreased strength, increased fascial restrictions, impaired flexibility, improper body mechanics, and postural dysfunction.    ACTIVITY LIMITATIONS community activity and caregiver for DTR .    PERSONAL FACTORS Fitness, Time since onset of injury/illness/exacerbation, and 1 comorbidity: medical history  are also affecting patient's functional outcome.      REHAB POTENTIAL: Good   CLINICAL DECISION MAKING: Stable/uncomplicated   EVALUATION COMPLEXITY: Low     GOALS: Goals reviewed with  patient? Yes   SHORT TERM GOALS: Target date: 08/06/2021   Pt to be I with HEP.  Baseline: Goal status: On going   2.  Pt to demonstrate improved coordination of pelvic floor and breathing with body weight squat without compensatory strategies to decrease strain at pelvic floor and prolapse.   Baseline:  Goal status: MET   3.  Pt to demonstrate 4/5 pelvic floor strength for improved pelvic stability Baseline:  Goal status: on going       [place] TERM GOALS: Target date: 10/09/2021   Pt to be I  with advanced HEP.   Baseline:  Goal status: INITIAL   2.  Pt to demonstrate improved coordination of pelvic floor and breathing with 15# squat without compensatory strategies to decrease strain at pelvic floor and prolapse.   Baseline:  Goal status: INITIAL   3.    Pt to demonstrate 5/5 pelvic floor strength for improved pelvic stability Baseline:  Goal status: INITIAL   4.  Pt to demonstrate at least 5/5 bil hip strength for improved pelvic stability .  Baseline:  Goal status: INITIAL     PLAN: PT FREQUENCY: 1x/week   PT DURATION:  6 sessions   PLANNED INTERVENTIONS: Therapeutic exercises, Therapeutic activity, Neuromuscular re-education, Patient/Family education, Joint mobilization, Spinal mobilization, Cryotherapy, Moist heat, Manual lymph drainage, scar mobilization, Taping, Biofeedback, and Manual therapy   PLAN FOR NEXT SESSION: coordination of pelvic floor with tasks    Stacy Gardner, PT, DPT 06/14/233:31 PM

## 2021-08-20 ENCOUNTER — Ambulatory Visit: Payer: Medicare PPO | Admitting: Physical Therapy

## 2021-08-20 ENCOUNTER — Encounter: Payer: Medicare PPO | Admitting: Physical Therapy

## 2021-08-28 ENCOUNTER — Ambulatory Visit: Payer: Medicare PPO | Admitting: Physical Therapy

## 2021-08-28 DIAGNOSIS — R293 Abnormal posture: Secondary | ICD-10-CM

## 2021-08-28 DIAGNOSIS — R279 Unspecified lack of coordination: Secondary | ICD-10-CM

## 2021-08-28 DIAGNOSIS — M6281 Muscle weakness (generalized): Secondary | ICD-10-CM | POA: Diagnosis not present

## 2021-08-28 DIAGNOSIS — F4323 Adjustment disorder with mixed anxiety and depressed mood: Secondary | ICD-10-CM | POA: Diagnosis not present

## 2021-08-28 DIAGNOSIS — M62838 Other muscle spasm: Secondary | ICD-10-CM | POA: Diagnosis not present

## 2021-08-28 NOTE — Therapy (Addendum)
OUTPATIENT PHYSICAL THERAPY TREATMENT NOTE   Patient Name: Sylvia Reynolds MRN: [id number] DOB:1950/11/04, 71 y.o., female Today's Date: 08/28/2021  PCP: Merrilee Seashore, MD REFERRING PROVIDER: Ileana Roup, MD  END OF SESSION:   PT End of Session - 08/28/21 0934     Visit Number 4    Date for PT Re-Evaluation 10/09/21    Authorization Type [employer]    Progress Note Due on Visit 10    PT Start Time 0933   pt arrival   PT Stop Time 1012    PT Time Calculation (min) 39 min    Activity Tolerance Patient tolerated treatment well    Behavior During Therapy WFL for tasks assessed/performed             Past Medical History:  Diagnosis Date   Arthritis    Closed fracture dislocation of right elbow    GERD (gastroesophageal reflux disease)    Hypertension    Osteopenia    Rheumatoid arthritis ([place])    Scoliosis    Shingles    UTI (urinary tract infection)    Past Surgical History:  Procedure Laterality Date   CESAREAN SECTION     ORIF ELBOW FRACTURE Right 07/01/2018   Procedure: OPEN REDUCTION INTERNAL FIXATION (ORIF) RIGHT ELBOW/OLECRANON FRACTURE;  Surgeon: Earlie Server, MD;  Location: [place];  Service: Orthopedics;  Laterality: Right;   Patient Active Problem List   Diagnosis Date Noted   Gastroesophageal reflux disease 07/11/2021   Rectal prolapse 07/11/2021   Screening for colon cancer 07/11/2021   RA (rheumatoid arthritis) ([place]) 08/11/2018   Hypertension with normal renal function 08/11/2018    REFERRING DIAG: K62.3 (ICD-10-CM) - Rectal prolapse    THERAPY DIAG:  Muscle weakness (generalized)  Unspecified lack of coordination  Abnormal posture  PERTINENT HISTORY: HTN, scoilosis, RA, osteopenia Sexual abuse: No  PRECAUTIONS: None  SUBJECTIVE: Pt reports she is feeling better. Pt has not had any prolapse symptoms. Pt reports squatty potty has been very helpful for her.   PAIN:  Are you having pain?  No   OBJECTIVE: (objective measures completed at initial evaluation unless otherwise dated)   DIAGNOSTIC FINDINGS:      COGNITION:            Overall cognitive status: Within functional limits for tasks assessed                          SENSATION:            Light touch: Appears intact            Proprioception: Appears intact   MUSCLE LENGTH: Bil hamstrings and adductors limited by 25%     GAIT: WFL but slight decreased Lt LE step length   POSTURE:   Pt does have scoliosis and with assessment, Lt ribs posteriorly rotated with visual rib hump present in lower back.     LUMBARAROM/PROM   A/PROM A/PROM  07/09/2021  Flexion Limited by 25%   Extension Limited by 50%  Right lateral flexion Limited by 25%  Left lateral flexion Limited by 50%  Right rotation Limited by 25%  Left rotation Limited by 25%   (Blank rows = not tested)   LE ROM:   WFL   LE MMT:   Bil hips 4/5; knees and ankles 5/5   PELVIC MMT:   MMT   07/09/2021  Vaginal    Internal Anal Sphincter 3/5  External Anal Sphincter  3/5  Puborectalis 3/5  Diastasis Recti    (Blank rows = not tested)         PALPATION:   General  no TTP                  External Perineal Exam no TTP does have excess skin present at external anal area.                              Internal Pelvic Floor no TTP   TONE: Mildly decreased   PROLAPSE: Not seen rectally   TODAY'S TREATMENT   08/28/21: Pt requested to have external pelvic floor looked to make sure no prolapse noted, this was completed did not see external prolapse rectally, pt does have excess skin around EAS but this has been there per pt.  2x10 bridges with ball squeezes X10 opp knee and hand press in hooklying  2x10 15# Sit to stand from mat table X10 4# mario punches 2X10 green band palloffs  08/14/2021: 2x10  bridges  2x10 opp hand/knee ball press Pelvic tilts x20 in standing (unable to complete in hooklying or sitting improved with standing and  ball at low back for feedback)  Quad alternating knee extension 2x10 Sit to stand from mat table 8# 2x10 Adductor squeezes in sitting 2x10 with exhale Mario punches 3# x10 each Seated bicep curls 2x10 3#  Farmer's Carry 500' 8# and 3#        PATIENT EDUCATION:  Education details: [id number] Person educated: Patient Education method: Explanation, Demonstration, Tactile cues, Verbal cues, and Handouts Education comprehension: verbalized understanding and returned demonstration     HOME EXERCISE PROGRAM: [id number]   ASSESSMENT:   CLINICAL IMPRESSION: Patient  session focused on hip and core strengthening with coordination of pelvic floor and breathing mechanics throughout with all exercises during treatment, demonstrated improved coordination with breathing and core activations and reports she felt able to contract pelvic floor when needed during exercise as well. Pt only needed minimal cues to complete for technique or coordination, overall improving strength, tolerance to activity, and improved coordination of tasks and lifting mechanics. Pt would benefit from additional PT to further address deficits.      OBJECTIVE IMPAIRMENTS decreased coordination, decreased endurance, decreased mobility, decreased strength, increased fascial restrictions, impaired flexibility, improper body mechanics, and postural dysfunction.    ACTIVITY LIMITATIONS community activity and caregiver for DTR .    PERSONAL FACTORS Fitness, Time since onset of injury/illness/exacerbation, and 1 comorbidity: medical history  are also affecting patient's functional outcome.      REHAB POTENTIAL: Good   CLINICAL DECISION MAKING: Stable/uncomplicated   EVALUATION COMPLEXITY: Low     GOALS: Goals reviewed with patient? Yes   SHORT TERM GOALS: Target date: 08/06/2021   Pt to be I with HEP.  Baseline: Goal status: MET   2.  Pt to demonstrate improved coordination of pelvic floor and breathing with body weight  squat without compensatory strategies to decrease strain at pelvic floor and prolapse.   Baseline:  Goal status: MET   3.  Pt to demonstrate 4/5 pelvic floor strength for improved pelvic stability Baseline:  Goal status: MET       [place] TERM GOALS: Target date: 10/09/2021   Pt to be I with advanced HEP.   Baseline:  Goal status: MET   2.  Pt to demonstrate improved coordination of pelvic floor and breathing with 15# squat without compensatory strategies to decrease  strain at pelvic floor and prolapse.   Baseline:  Goal status: MET   3.    Pt to demonstrate 5/5 pelvic floor strength for improved pelvic stability Baseline:  Goal status: ON GOING   4.  Pt to demonstrate at least 5/5 bil hip strength for improved pelvic stability .  Baseline:  Goal status: MET     PLAN: PT FREQUENCY: 1x/week   PT DURATION:  6 sessions   PLANNED INTERVENTIONS: Therapeutic exercises, Therapeutic activity, Neuromuscular re-education, Patient/Family education, Joint mobilization, Spinal mobilization, Cryotherapy, Moist heat, Manual lymph drainage, scar mobilization, Taping, Biofeedback, and Manual therapy   PLAN FOR NEXT SESSION: coordination of pelvic floor with tasks    Stacy Gardner, PT, DPT 08/28/2308:12 AM  PHYSICAL THERAPY DISCHARGE SUMMARY  Visits from Start of Care: 4  Current functional level related to goals / functional outcomes: All STG met, 3/4 LTG met   Remaining deficits: Unable to formally reassess as pt has not scheduled more appointments or returned since last visit.    Education / Equipment: HEP   Patient agrees to discharge. Patient goals were partially met. Patient is being discharged due to not returning since the last visit. Thank you for the referral.   Stacy Gardner, PT, DPT 10/23/2308:15 AM

## 2021-09-13 DIAGNOSIS — F4323 Adjustment disorder with mixed anxiety and depressed mood: Secondary | ICD-10-CM | POA: Diagnosis not present

## 2021-09-16 ENCOUNTER — Encounter: Payer: Self-pay | Admitting: Gastroenterology

## 2021-09-19 ENCOUNTER — Encounter: Payer: Self-pay | Admitting: Gastroenterology

## 2021-09-20 ENCOUNTER — Encounter: Payer: Self-pay | Admitting: Gastroenterology

## 2021-09-20 ENCOUNTER — Ambulatory Visit (AMBULATORY_SURGERY_CENTER): Payer: Medicare PPO | Admitting: Gastroenterology

## 2021-09-20 VITALS — BP 129/59 | HR 58 | Temp 97.5°F | Resp 12 | Ht 59.0 in | Wt 119.0 lb

## 2021-09-20 DIAGNOSIS — D122 Benign neoplasm of ascending colon: Secondary | ICD-10-CM | POA: Diagnosis not present

## 2021-09-20 DIAGNOSIS — Z1211 Encounter for screening for malignant neoplasm of colon: Secondary | ICD-10-CM

## 2021-09-20 DIAGNOSIS — K317 Polyp of stomach and duodenum: Secondary | ICD-10-CM

## 2021-09-20 DIAGNOSIS — K2100 Gastro-esophageal reflux disease with esophagitis, without bleeding: Secondary | ICD-10-CM | POA: Diagnosis not present

## 2021-09-20 DIAGNOSIS — K2970 Gastritis, unspecified, without bleeding: Secondary | ICD-10-CM

## 2021-09-20 DIAGNOSIS — D123 Benign neoplasm of transverse colon: Secondary | ICD-10-CM

## 2021-09-20 DIAGNOSIS — K623 Rectal prolapse: Secondary | ICD-10-CM

## 2021-09-20 DIAGNOSIS — K219 Gastro-esophageal reflux disease without esophagitis: Secondary | ICD-10-CM

## 2021-09-20 MED ORDER — PANTOPRAZOLE SODIUM 40 MG PO TBEC: 40.0000 mg | DELAYED_RELEASE_TABLET | Freq: Every day | ORAL | 3 refills | Status: DC

## 2021-09-20 MED ORDER — SODIUM CHLORIDE 0.9 % IV SOLN: 500.0000 mL | Freq: Once | INTRAVENOUS | Status: DC

## 2021-09-20 NOTE — Op Note (Signed)
[facility] Patient Name: Sylvia Reynolds Procedure Date: 09/20/2021 3:17 PM MRN: [id number] Endoscopist: Mauri Pole , MD Age: 71 Referring MD:  Date of Birth: [birth year] Gender: Female Account #: [id number] Procedure:                Upper GI endoscopy Indications:              Esophageal reflux symptoms that recur despite                            appropriate therapy Medicines:                Monitored Anesthesia Care Procedure:                Pre-Anesthesia Assessment:                           - Prior to the procedure, a History and Physical                            was performed, and patient medications and                            allergies were reviewed. The patient's tolerance of                            previous anesthesia was also reviewed. The risks                            and benefits of the procedure and the sedation                            options and risks were discussed with the patient.                            All questions were answered, and informed consent                            was obtained. Prior Anticoagulants: The patient has                            taken no previous anticoagulant or antiplatelet                            agents. ASA Grade Assessment: II - A patient with                            mild systemic disease. After reviewing the risks                            and benefits, the patient was deemed in                            satisfactory condition to undergo the procedure.  After obtaining informed consent, the endoscope was                            passed under direct vision. Throughout the                            procedure, the patient's blood pressure, pulse, and                            oxygen saturations were monitored continuously. The                            GIF [id number] #[id number] was introduced through the                            mouth, and advanced to the second part of  duodenum.                            The upper GI endoscopy was accomplished without                            difficulty. The patient tolerated the procedure                            well. Scope In: Scope Out: Findings:                 LA Grade C (one or more mucosal breaks continuous                            between tops of 2 or more mucosal folds, less than                            75% circumference) esophagitis with no bleeding was                            found 33 to 36 cm from the incisors.                           A medium-sized hiatal hernia was present.                           Patchy minimal inflammation characterized by                            congestion (edema) and erythema was found in the                            entire examined stomach. Biopsies were taken with a                            cold forceps for Helicobacter pylori testing.  Multiple less than 5 mm pedunculated and sessile                            polyps with no stigmata of recent bleeding were                            found in the gastric fundus and in the gastric                            body. Biopsies were taken with a cold forceps for                            histology.                           The examined duodenum was normal. Complications:            No immediate complications. Estimated Blood Loss:     Estimated blood loss was minimal. Impression:               - LA Grade C erosive esophagitis with no bleeding.                           - Medium-sized hiatal hernia.                           - Gastritis. Biopsied.                           - Multiple gastric polyps. Biopsied.                           - Normal examined duodenum. Recommendation:           - Resume previous diet.                           - Continue present medications.                           - Await pathology results.                           - Follow an antireflux regimen.                            - Use Protonix (pantoprazole) 40 mg PO daily. Rx                            for 30 days with 3 refills Mauri Pole, MD 09/20/2021 4:23:29 PM This report has been signed electronically.

## 2021-09-20 NOTE — Progress Notes (Signed)
Pt's states no medical or surgical changes since previsit or office visit. 

## 2021-09-20 NOTE — Progress Notes (Signed)
[facility] History and Physical   Primary Care Physician:  Merrilee Seashore, MD   Reason for Procedure:  GERD and colorectal cancer screening  Plan:    EGD and colonoscopy with possible interventions as needed     HPI: Sylvia Reynolds is a very pleasant 71 y.o. female here for EGD and colonoscopy for persistent GERD and colorectal cancer screening.   The risks and benefits as well as alternatives of endoscopic procedure(s) have been discussed and reviewed. All questions answered. The patient agrees to proceed.    Past Medical History:  Diagnosis Date   Arthritis    Closed fracture dislocation of right elbow    GERD (gastroesophageal reflux disease)    Hypertension    Osteopenia    Rheumatoid arthritis ([place])    Scoliosis    Shingles    UTI (urinary tract infection)     Past Surgical History:  Procedure Laterality Date   CESAREAN SECTION     ORIF ELBOW FRACTURE Right 07/01/2018   Procedure: OPEN REDUCTION INTERNAL FIXATION (ORIF) RIGHT ELBOW/OLECRANON FRACTURE;  Surgeon: Earlie Server, MD;  Location: [facility];  Service: Orthopedics;  Laterality: Right;    Prior to Admission medications   Medication Sig Start Date End Date Taking? Authorizing Provider  Ascorbic Acid (VITAMIN C) 1000 MG tablet Take 1,000 mg by mouth daily.   Yes [provider]  atenolol (TENORMIN) 25 MG tablet Take 25 mg by mouth daily.   Yes [provider]  benazepril (LOTENSIN) 20 MG tablet Take 20 mg by mouth daily.   Yes [provider]  calcium citrate-vitamin D (CITRACAL+D) 315-200 MG-UNIT tablet Take 2 tablets by mouth daily.   Yes [provider]  folic acid (FOLVITE) 1 MG tablet Take 1 mg by mouth daily.   Yes [provider]  loratadine (CLARITIN) 10 MG tablet Take 10 mg by mouth daily.   Yes [provider]  Lysine 1000 MG TABS Take 1 tablet by mouth daily.   Yes [provider]  methotrexate 2.5 MG  tablet Take 15 mg by mouth once a week.   Yes [provider]  Multiple Vitamins-Minerals (MULTI COMPLETE/IRON PO) Take 1 tablet by mouth 2 (two) times daily.   Yes [provider]  Omega-3 Fatty Acids (FISH OIL) 1200 MG CAPS Take 2 capsules by mouth daily.   Yes [provider]  polyethylene glycol powder (GLYCOLAX/MIRALAX) 17 GM/SCOOP powder  06/06/21  Yes [provider]    Current Outpatient Medications  Medication Sig Dispense Refill   Ascorbic Acid (VITAMIN C) 1000 MG tablet Take 1,000 mg by mouth daily.     atenolol (TENORMIN) 25 MG tablet Take 25 mg by mouth daily.     benazepril (LOTENSIN) 20 MG tablet Take 20 mg by mouth daily.     calcium citrate-vitamin D (CITRACAL+D) 315-200 MG-UNIT tablet Take 2 tablets by mouth daily.     folic acid (FOLVITE) 1 MG tablet Take 1 mg by mouth daily.     loratadine (CLARITIN) 10 MG tablet Take 10 mg by mouth daily.     Lysine 1000 MG TABS Take 1 tablet by mouth daily.     methotrexate 2.5 MG tablet Take 15 mg by mouth once a week.     Multiple Vitamins-Minerals (MULTI COMPLETE/IRON PO) Take 1 tablet by mouth 2 (two) times daily.     Omega-3 Fatty Acids (FISH OIL) 1200 MG CAPS Take 2 capsules by mouth daily.     polyethylene  glycol powder (GLYCOLAX/MIRALAX) 17 GM/SCOOP powder      Current Facility-Administered Medications  Medication Dose Route Frequency Provider Last Rate Last Admin   0.9 %  sodium chloride infusion  500 mL Intravenous Once Chick Cousins, Venia Minks, MD        Allergies as of 09/20/2021 - Review Complete 09/20/2021  Allergen Reaction Noted   Zithromax [azithromycin] Diarrhea 06/04/2016   Ceftin [cefuroxime axetil] Nausea Only 06/04/2016   Tape Rash 07/01/2018    Family History  Problem Relation Age of Onset   Hypertension Mother    Parkinson's disease Mother    Cancer Father        prostate, chronic lymph leukemia   Heart failure Father    Hypertension Father    Hypertension Sister     Cancer Brother        CLL   Kidney Stones Brother    Thyroid disease Maternal Grandfather    Breast cancer Maternal Aunt    Breast cancer Maternal Aunt    Breast cancer Maternal Aunt    Breast cancer Cousin    Colon cancer Other        1st cousin   Stomach cancer Neg Hx    Esophageal cancer Neg Hx     Social History   Socioeconomic History   Marital status: Married    Spouse name: Not on file   Number of children: 2   Years of education: Not on file   Highest education level: Not on file  Occupational History   Occupation: retired  Tobacco Use   Smoking status: Never   Smokeless tobacco: Never  Vaping Use   Vaping Use: Never used  Substance and Sexual Activity   Alcohol use: Yes    Comment: occasional   Drug use: No    Comment: h/o marijuana use in college   Sexual activity: Not Currently    Birth control/protection: None    Comment: First sexual encounter at 71 yrs old. Few than 5 partners.  Other Topics Concern   Not on file  Social History Narrative   Not on file   Social Determinants of Health   Financial Resource Strain: Not on file  Food Insecurity: Not on file  Transportation Needs: Not on file  Physical Activity: Not on file  Stress: Not on file  Social Connections: Not on file  Intimate Partner Violence: Not on file    Review of Systems:  All other review of systems negative except as mentioned in the HPI.  Physical Exam: Vital signs in last 24 hours: BP (!) 145/75   Pulse 62   Temp (!) 97.5 F (36.4 C)   Ht '4\' 11"'$  (1.499 m)   Wt 119 lb (54 kg)   SpO2 100%   BMI 24.04 kg/m  General:   Alert, NAD Lungs:  Clear .   Heart:  Regular rate and rhythm Abdomen:  Soft, nontender and nondistended. Neuro/Psych:  Alert and cooperative. Normal mood and affect. A and O x 3  Reviewed labs, radiology imaging, old records and pertinent past GI work up  Patient is appropriate for planned procedure(s) and anesthesia in an ambulatory setting   K.  Denzil Magnuson , MD [phone]

## 2021-09-20 NOTE — Progress Notes (Signed)
A and O x3. Report to RN. Tolerated MAC anesthesia well.Teeth unchanged after procedure. 

## 2021-09-20 NOTE — Patient Instructions (Signed)
Upper- Resume previous diet and medications. Start Protonix 40 MG daily. Awaiting pathology results.  Lower-Schedule virtual Colonoscopy with CT next available appointment to exclude any large mass lesions in the cecum.  YOU HAD AN ENDOSCOPIC PROCEDURE TODAY AT [facility]:   Refer to the procedure report that was given to you for any specific questions about what was found during the examination.  If the procedure report does not answer your questions, please call your gastroenterologist to clarify.  If you requested that your care partner not be given the details of your procedure findings, then the procedure report has been included in a sealed envelope for you to review at your convenience later.  YOU SHOULD EXPECT: Some feelings of bloating in the abdomen. Passage of more gas than usual.  Walking can help get rid of the air that was put into your GI tract during the procedure and reduce the bloating. If you had a lower endoscopy (such as a colonoscopy or flexible sigmoidoscopy) you may notice spotting of blood in your stool or on the toilet paper. If you underwent a bowel prep for your procedure, you may not have a normal bowel movement for a few days.  Please Note:  You might notice some irritation and congestion in your nose or some drainage.  This is from the oxygen used during your procedure.  There is no need for concern and it should clear up in a day or so.  SYMPTOMS TO REPORT IMMEDIATELY:  Following lower endoscopy (colonoscopy or flexible sigmoidoscopy):  Excessive amounts of blood in the stool  Significant tenderness or worsening of abdominal pains  Swelling of the abdomen that is new, acute  Fever of 100F or higher  Following upper endoscopy (EGD)  Vomiting of blood or coffee ground material  New chest pain or pain under the shoulder blades  Painful or persistently difficult swallowing  New shortness of breath  Fever of 100F or higher  Black, tarry-looking  stools  For urgent or emergent issues, a gastroenterologist can be reached at any hour by calling [phone]. Do not use MyChart messaging for urgent concerns.    DIET:  We do recommend a small meal at first, but then you may proceed to your regular diet.  Drink plenty of fluids but you should avoid alcoholic beverages for 24 hours.  ACTIVITY:  You should plan to take it easy for the rest of today and you should NOT DRIVE or use heavy machinery until tomorrow (because of the sedation medicines used during the test).    FOLLOW UP: Our staff will call the number listed on your records the next business day following your procedure.  We will call around 7:15- 8:00 am to check on you and address any questions or concerns that you may have regarding the information given to you following your procedure. If we do not reach you, we will leave a message.  If you develop any symptoms (ie: fever, flu-like symptoms, shortness of breath, cough etc.) before then, please call [phone].  If you test positive for Covid 19 in the 2 weeks post procedure, please call and report this information to [place].    If any biopsies were taken you will be contacted by phone or by letter within the next 1-3 weeks.  Please call us at [phone] if you have not heard about the biopsies in 3 weeks.    SIGNATURES/CONFIDENTIALITY: You and/or your care partner have signed paperwork which will be entered into  your electronic medical record.  These signatures attest to the fact that that the information above on your After Visit Summary has been reviewed and is understood.  Full responsibility of the confidentiality of this discharge information lies with you and/or your care-partner.

## 2021-09-20 NOTE — Op Note (Signed)
[facility] Patient Name: Sylvia Reynolds Procedure Date: 09/20/2021 3:08 PM MRN: [id number] Endoscopist: Mauri Pole , MD Age: 71 Referring MD:  Date of Birth: 1950/12/18 Gender: Female Account #: [id number] Procedure:                Colonoscopy Indications:              Screening for colorectal malignant neoplasm.                            Incomplete colonoscopy previously Medicines:                Monitored Anesthesia Care Procedure:                Pre-Anesthesia Assessment:                           - Prior to the procedure, a History and Physical                            was performed, and patient medications and                            allergies were reviewed. The patient's tolerance of                            previous anesthesia was also reviewed. The risks                            and benefits of the procedure and the sedation                            options and risks were discussed with the patient.                            All questions were answered, and informed consent                            was obtained. Prior Anticoagulants: The patient has                            taken no previous anticoagulant or antiplatelet                            agents. ASA Grade Assessment: II - A patient with                            mild systemic disease. After reviewing the risks                            and benefits, the patient was deemed in                            satisfactory condition to undergo the procedure.  After obtaining informed consent, the colonoscope                            was passed under direct vision. Throughout the                            procedure, the patient's blood pressure, pulse, and                            oxygen saturations were monitored continuously. The                            0441 PCF-H190TL Slim SB Colonoscope was introduced                            through the anus with the  intention of advancing to                            the cecum. The scope was advanced to the ascending                            colon before the procedure was aborted. Medications                            were given. The colonoscopy was technically                            difficult and complex due to a redundant colon,                            significant looping, a tortuous colon and the                            patient's body habitus. Successful completion of                            the procedure was aided by applying abdominal                            pressure. The patient tolerated the procedure well.                            The quality of the bowel preparation was good. The                            ileocecal valve, appendiceal orifice, and rectum                            were photographed. Scope In: 3:32:08 PM Scope Out: 4:17:52 PM Scope Withdrawal Time: 0 hours 13 minutes 12 seconds  Total Procedure Duration: 0 hours 45 minutes 44 seconds  Findings:                 The digital rectal exam findings include  decreased                            sphincter tone.                           Two sessile polyps were found in the transverse                            colon and ascending colon. The polyps were 4 to 12                            mm in size. These polyps were removed with a cold                            snare. Resection and retrieval were complete.                           Non-bleeding external and internal hemorrhoids were                            found during retroflexion. The hemorrhoids were                            medium-sized. Complications:            No immediate complications. Estimated Blood Loss:     Estimated blood loss was minimal. Impression:               - Decreased sphincter tone found on digital rectal                            exam.                           - Two 4 to 12 mm polyps in the transverse colon and                             in the ascending colon, removed with a cold snare.                            Resected and retrieved.                           - Non-bleeding external and internal hemorrhoids. Recommendation:           - Patient has a contact number available for                            emergencies. The signs and symptoms of potential                            delayed complications were discussed with the                            patient. Return to normal activities tomorrow.  Written discharge instructions were provided to the                            patient.                           - Resume previous diet.                           - Continue present medications.                           - Await pathology results.                           - Schedule virtual colonoscopy with CT next                            available appointment to exclude any large mass                            lesions in the cecum Mauri Pole, MD 09/20/2021 4:29:13 PM This report has been signed electronically.

## 2021-09-23 ENCOUNTER — Telehealth: Payer: Self-pay

## 2021-09-23 NOTE — Telephone Encounter (Signed)
  Follow up Call-     09/20/2021    2:42 PM  Call back number  Post procedure Call Back phone  # [phone]  Permission to leave phone message Yes     Left message

## 2021-09-24 ENCOUNTER — Other Ambulatory Visit: Payer: Self-pay

## 2021-09-24 DIAGNOSIS — D123 Benign neoplasm of transverse colon: Secondary | ICD-10-CM

## 2021-09-24 DIAGNOSIS — Z86010 Personal history of colonic polyps: Secondary | ICD-10-CM

## 2021-09-24 DIAGNOSIS — Q438 Other specified congenital malformations of intestine: Secondary | ICD-10-CM

## 2021-09-25 ENCOUNTER — Telehealth: Payer: Self-pay | Admitting: Gastroenterology

## 2021-09-26 DIAGNOSIS — F4323 Adjustment disorder with mixed anxiety and depressed mood: Secondary | ICD-10-CM | POA: Diagnosis not present

## 2021-09-26 NOTE — Telephone Encounter (Signed)
Spoke with the patient. She had spoken with [facility]. The scheduler's "headset went dead during the phone call and it did not get scheduled." She has a phone number to call and will attend to that asap.  Patient has many questions about her colon cancer screening going forward. She reports she has made diet and lifestyle modifications. Feels the Pantoprazole is beginning to improve her symptoms.  Plan for patient is to get the virtual colonoscopy scheduled soon, then call back to [facility] and schedule a follow up appointment with Dr Silverio Decamp to discuss findings and make a plan for future screening.

## 2021-09-26 NOTE — Telephone Encounter (Signed)
Order for virtual CT was sent to [facility] on 09/24/21. [facility] has acknowledged the order.  Called the patient. No answer. Left the phone number to [facility] for her to call and arrange the virtual imaging.  Left message to call me back to discuss Pantoprazole or to communicate through My Chart.

## 2021-09-26 NOTE — Telephone Encounter (Signed)
Patient returned your call.  She said the only time she would not be available to speak to you today would be between 12 and 1.  Any other time, please feel free to call.  Thank you.

## 2021-10-02 DIAGNOSIS — M8580 Other specified disorders of bone density and structure, unspecified site: Secondary | ICD-10-CM | POA: Diagnosis not present

## 2021-10-02 DIAGNOSIS — M1991 Primary osteoarthritis, unspecified site: Secondary | ICD-10-CM | POA: Diagnosis not present

## 2021-10-02 DIAGNOSIS — M25551 Pain in right hip: Secondary | ICD-10-CM | POA: Diagnosis not present

## 2021-10-02 DIAGNOSIS — Z6824 Body mass index (BMI) 24.0-24.9, adult: Secondary | ICD-10-CM | POA: Diagnosis not present

## 2021-10-02 DIAGNOSIS — M79671 Pain in right foot: Secondary | ICD-10-CM | POA: Diagnosis not present

## 2021-10-02 DIAGNOSIS — M0589 Other rheumatoid arthritis with rheumatoid factor of multiple sites: Secondary | ICD-10-CM | POA: Diagnosis not present

## 2021-10-03 ENCOUNTER — Other Ambulatory Visit: Payer: Self-pay | Admitting: Internal Medicine

## 2021-10-03 DIAGNOSIS — Z1231 Encounter for screening mammogram for malignant neoplasm of breast: Secondary | ICD-10-CM

## 2021-10-09 ENCOUNTER — Ambulatory Visit (INDEPENDENT_AMBULATORY_CARE_PROVIDER_SITE_OTHER): Payer: Medicare PPO | Admitting: Obstetrics & Gynecology

## 2021-10-09 VITALS — BP 108/64 | HR 62 | Ht 58.25 in | Wt 120.0 lb

## 2021-10-09 DIAGNOSIS — Z780 Asymptomatic menopausal state: Secondary | ICD-10-CM

## 2021-10-09 DIAGNOSIS — M8580 Other specified disorders of bone density and structure, unspecified site: Secondary | ICD-10-CM

## 2021-10-09 DIAGNOSIS — Z01419 Encounter for gynecological examination (general) (routine) without abnormal findings: Secondary | ICD-10-CM

## 2021-10-09 NOTE — Progress Notes (Signed)
[place] 10-07-1950 [id number]   History:    71 y.o. G1P1L2  Married. Twin pregnancy.  1 daughter with cerebral palsy living with them.   RP:  Established patient presenting for annual gyn exam    HPI: Postmenopausal, well on no HRT.  No PMB.  No pelvic pain.  Abstinent as husband had Prostate Ca.  Pap Neg 08/2018.  No significant h/o abnormal Pap.  Breasts normal.  Mammo 11/2020 Neg.  Colono 08/2021, not able to complete. Osteopenia BD 10/2019, primary care manages.  Hypertension, rheumatoid arthritis on methotrexate.  BMI 24.87. Health labs with Fam MD.   Past medical history,surgical history, family history and social history were all reviewed and documented in the EPIC chart.  Gynecologic History No LMP recorded. Patient is postmenopausal.  Obstetric History OB History  Gravida Para Term Preterm AB Living  '1 1   1   2  '$ SAB IAB Ectopic Multiple Live Births        1 2    # Outcome Date GA Lbr Len/2nd Weight Sex Delivery Anes PTL Lv  1A Preterm 08/25/83 [redacted]w[redacted]d 4 lb 13 oz (2.183 kg) F Vag-Forceps  Y LIV  1B Preterm 08/25/83 364w0d4 lb (1.814 kg) F CS-Unspec  Y LIV     ROS: A ROS was performed and pertinent positives and negatives are included in the history.  GENERAL: No fevers or chills. HEENT: No change in vision, no earache, sore throat or sinus congestion. NECK: No pain or stiffness. CARDIOVASCULAR: No chest pain or pressure. No palpitations. PULMONARY: No shortness of breath, cough or wheeze. GASTROINTESTINAL: No abdominal pain, nausea, vomiting or diarrhea, melena or bright red blood per rectum. GENITOURINARY: No urinary frequency, urgency, hesitancy or dysuria. MUSCULOSKELETAL: No joint or muscle pain, no back pain, no recent trauma. DERMATOLOGIC: No rash, no itching, no lesions. ENDOCRINE: No polyuria, polydipsia, no heat or cold intolerance. No recent change in weight. HEMATOLOGICAL: No anemia or easy bruising or bleeding. NEUROLOGIC: No headache, seizures, numbness,  tingling or weakness. PSYCHIATRIC: No depression, no loss of interest in normal activity or change in sleep pattern.     Exam:   BP 108/64   Pulse 62   Ht 4' 10.25" (1.48 m)   Wt 120 lb (54.4 kg)   SpO2 99%   BMI 24.87 kg/m   Body mass index is 24.87 kg/m.  General appearance : Well developed well nourished female. No acute distress HEENT: Eyes: no retinal hemorrhage or exudates,  Neck supple, trachea midline, no carotid bruits, no thyroidmegaly Lungs: Clear to auscultation, no rhonchi or wheezes, or rib retractions  Heart: Regular rate and rhythm, no murmurs or gallops Breast:Examined in sitting and supine position were symmetrical in appearance, no palpable masses or tenderness,  no skin retraction, no nipple inversion, no nipple discharge, no skin discoloration, no axillary or supraclavicular lymphadenopathy Abdomen: no palpable masses or tenderness, no rebound or guarding Extremities: no edema or skin discoloration or tenderness  Pelvic: Vulva: Normal             Vagina: No gross lesions or discharge  Cervix: No gross lesions or discharge  Uterus  AV, normal size, shape and consistency, non-tender and mobile  Adnexa  Without masses or tenderness  Anus: Normal   Assessment/Plan:  [age].o. female for annual exam   1. Well female exam with routine gynecological exam Postmenopausal, well on no HRT.  No PMB.  No pelvic pain.  Abstinent as husband had Prostate  Ca.  Pap Neg 08/2018.  No significant h/o abnormal Pap.  Breasts normal.  Mammo 11/2020 Neg. Colono 08/2021, not able to complete. Osteopenia BD 10/2019, primary care manages.  Hypertension, rheumatoid arthritis on methotrexate.  BMI 24.87. Health labs with Fam MD.  2. Postmenopause Postmenopausal, well on no HRT.  No PMB.  No pelvic pain.  Abstinent as husband had Prostate Ca.    3. Osteopenia, unspecified location Osteopenia BD 10/2019, primary care manages.  Good Ca++ intake, Vit D.  Other orders - cholecalciferol  (VITAMIN D3) 25 MCG (1000 UNIT) tablet - Ascorbic Acid (VITAMIN C PO); Take by mouth.   Princess Bruins MD, 11:55 AM 10/09/2021

## 2021-10-11 DIAGNOSIS — F4323 Adjustment disorder with mixed anxiety and depressed mood: Secondary | ICD-10-CM | POA: Diagnosis not present

## 2021-10-29 ENCOUNTER — Encounter: Payer: Self-pay | Admitting: Gastroenterology

## 2021-10-30 ENCOUNTER — Telehealth: Payer: Self-pay | Admitting: Gastroenterology

## 2021-10-30 DIAGNOSIS — F4323 Adjustment disorder with mixed anxiety and depressed mood: Secondary | ICD-10-CM | POA: Diagnosis not present

## 2021-10-30 NOTE — Telephone Encounter (Signed)
Received a call from [facility] regarding this patient's upcoming CT-Virtual Colonoscopy.  He said this does require a prior authorization and [facility], [phone], needs to be called for it.  Thank you.

## 2021-11-14 ENCOUNTER — Ambulatory Visit: Payer: Medicare PPO

## 2021-11-14 DIAGNOSIS — F4323 Adjustment disorder with mixed anxiety and depressed mood: Secondary | ICD-10-CM | POA: Diagnosis not present

## 2021-11-28 DIAGNOSIS — F4323 Adjustment disorder with mixed anxiety and depressed mood: Secondary | ICD-10-CM | POA: Diagnosis not present

## 2021-12-05 DIAGNOSIS — F4323 Adjustment disorder with mixed anxiety and depressed mood: Secondary | ICD-10-CM | POA: Diagnosis not present

## 2021-12-06 ENCOUNTER — Ambulatory Visit
Admission: RE | Admit: 2021-12-06 | Discharge: 2021-12-06 | Disposition: A | Discharge status: Home / Self Care | Payer: Medicare PPO | Source: Ambulatory Visit | Attending: Internal Medicine | Admitting: Internal Medicine

## 2021-12-06 DIAGNOSIS — Z1231 Encounter for screening mammogram for malignant neoplasm of breast: Secondary | ICD-10-CM

## 2021-12-09 IMAGING — MG DIGITAL SCREENING BILAT W/ TOMO W/ CAD
6 of 12 series · 6 of 36 positions shown · non-contrast
Comparison: Previous exam(s).

CLINICAL DATA: Screening.

EXAM:
DIGITAL SCREENING BILATERAL MAMMOGRAM WITH TOMO AND CAD

[L MLO synth-2D (1 of 3)]
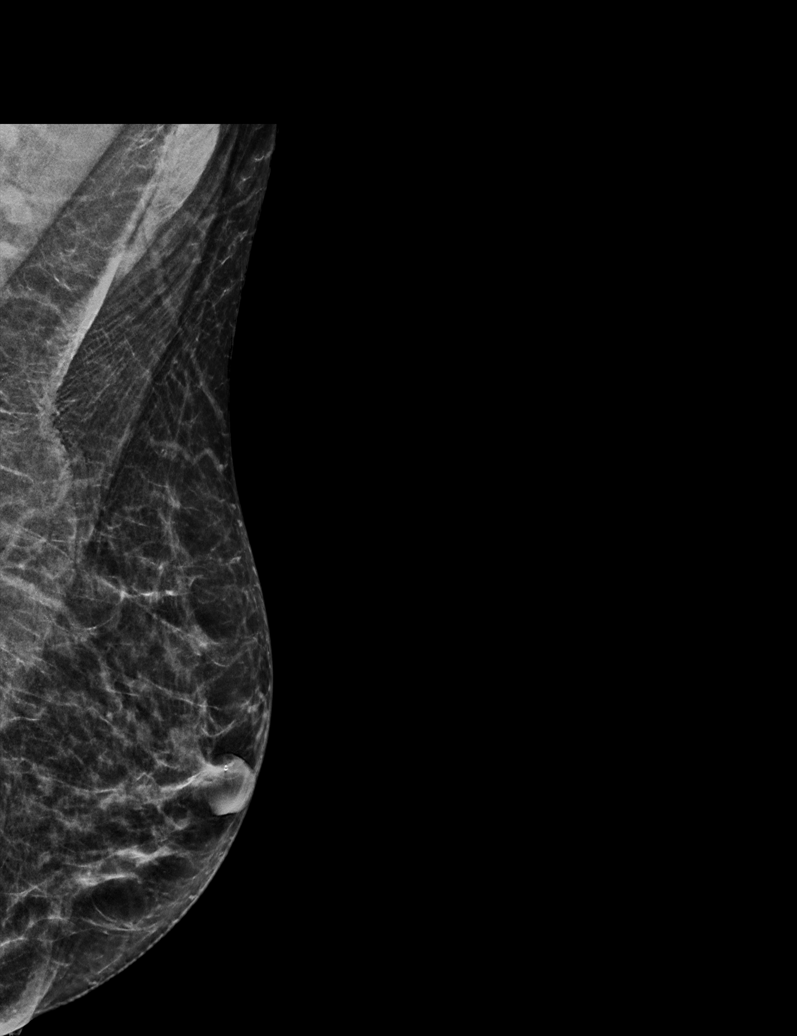

[L CC synth-2D]
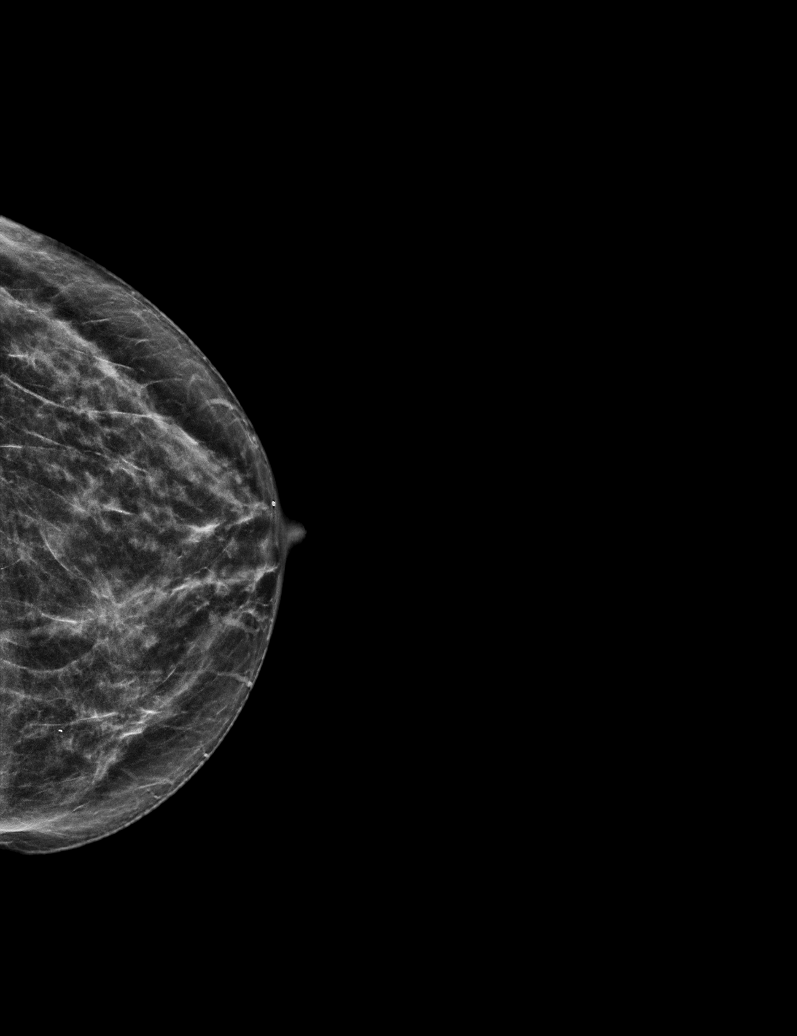

[L MLO synth-2D (2 of 3)]
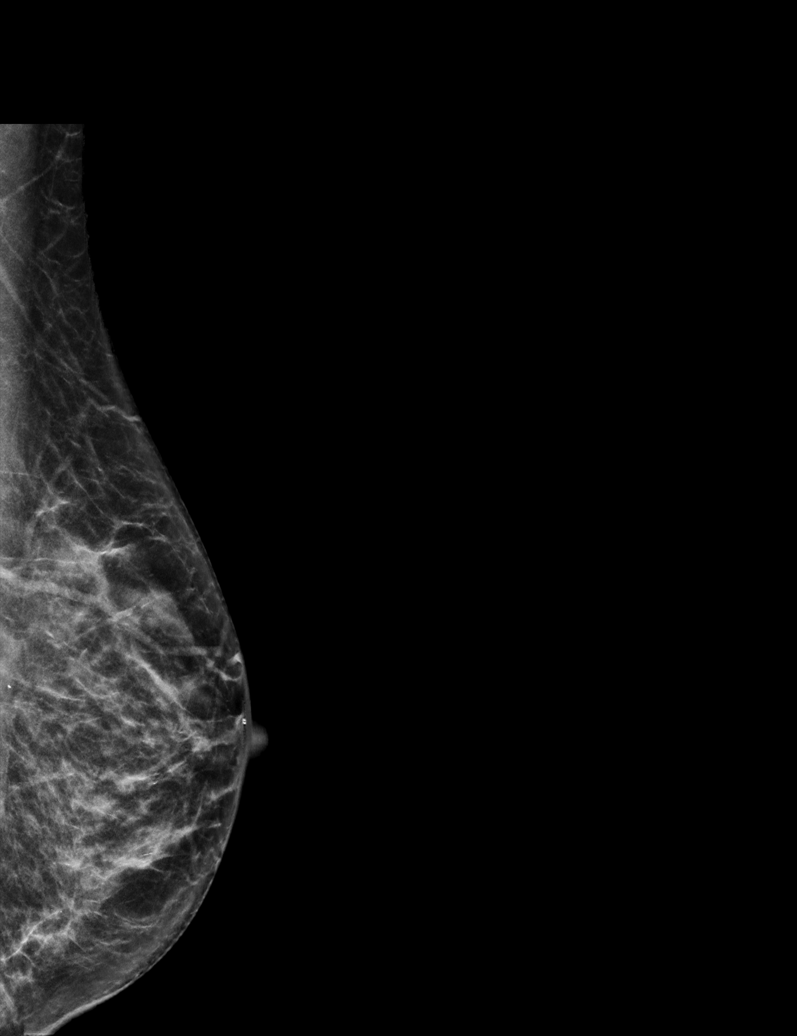

[R CC synth-2D]
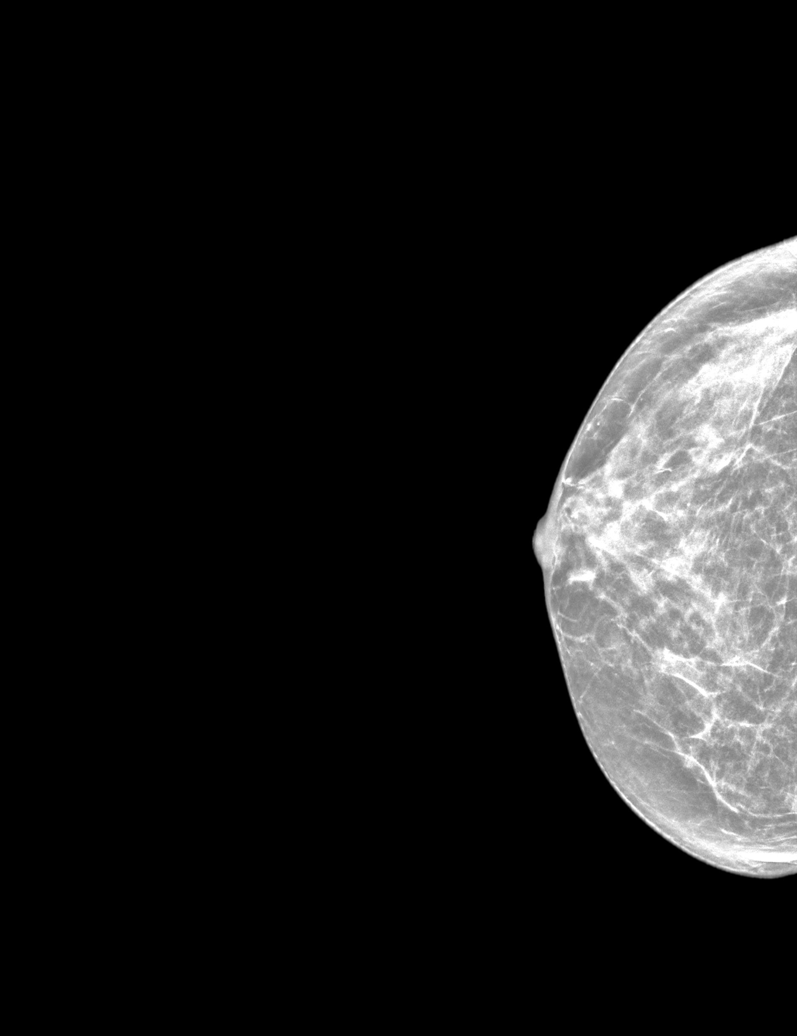

[R MLO synth-2D]
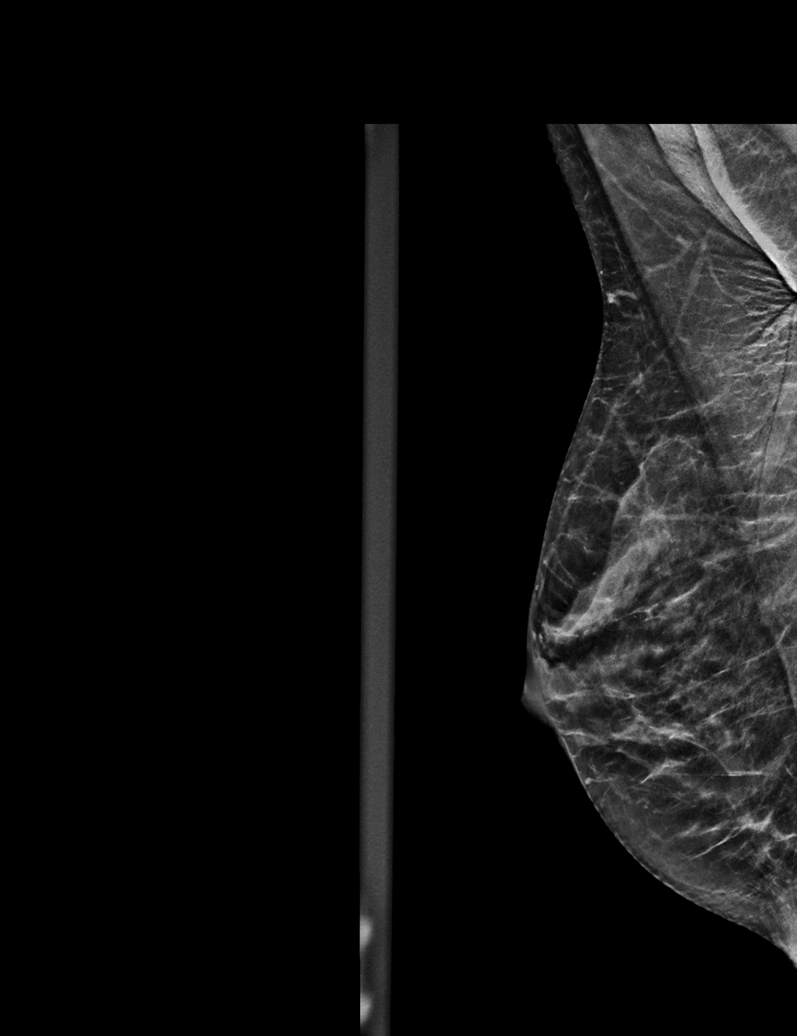

[L MLO synth-2D (3 of 3)]
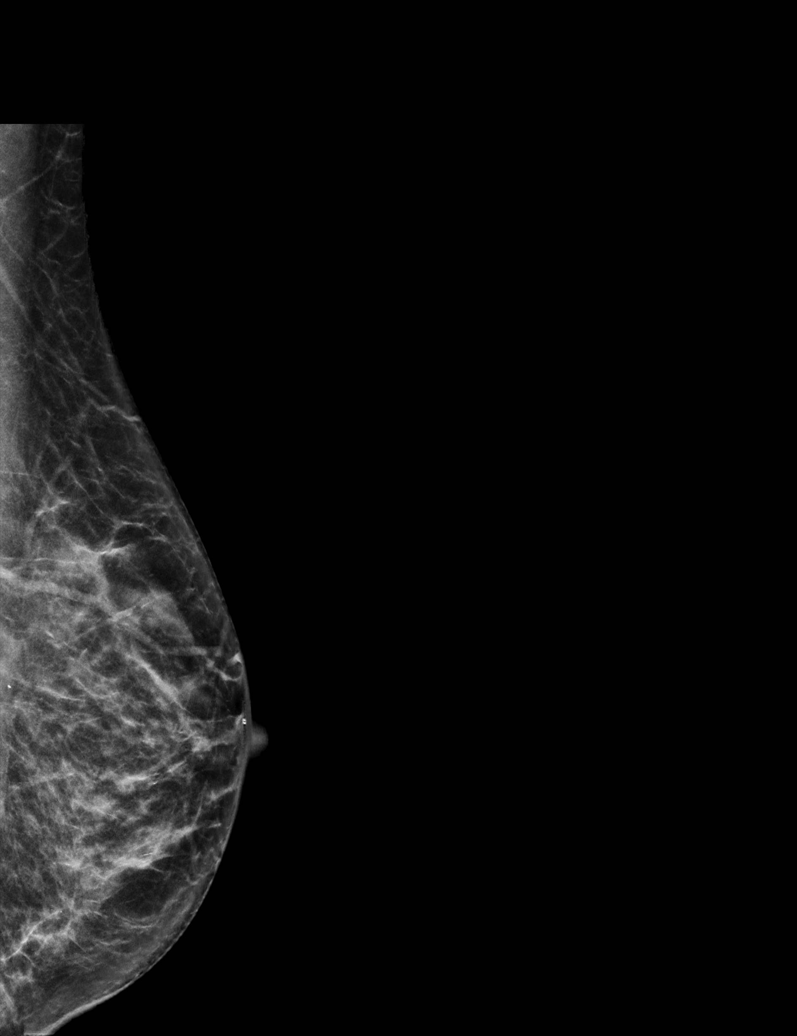

[6 of 36 positions shown; findings below may reference images not displayed]

ACR Breast Density Category c: The breast tissue is heterogeneously
dense, which may obscure small masses.
FINDINGS: There are no findings suspicious for malignancy. Images were
processed with CAD.
IMPRESSION: No mammographic evidence of malignancy. A result letter of this
screening mammogram will be mailed directly to the patient.

RECOMMENDATION:
Screening mammogram in one year. (Code:FT-U-LHB)

BI-RADS CATEGORY  1: Negative.

## 2021-12-12 ENCOUNTER — Ambulatory Visit
Admission: RE | Admit: 2021-12-12 | Discharge: 2021-12-12 | Disposition: A | Discharge status: Home / Self Care | Payer: Medicare PPO | Source: Ambulatory Visit | Attending: Gastroenterology | Admitting: Gastroenterology

## 2021-12-12 DIAGNOSIS — Z86010 Personal history of colonic polyps: Secondary | ICD-10-CM

## 2021-12-12 DIAGNOSIS — Q438 Other specified congenital malformations of intestine: Secondary | ICD-10-CM

## 2021-12-17 DIAGNOSIS — E785 Hyperlipidemia, unspecified: Secondary | ICD-10-CM | POA: Diagnosis not present

## 2021-12-17 DIAGNOSIS — M810 Age-related osteoporosis without current pathological fracture: Secondary | ICD-10-CM | POA: Diagnosis not present

## 2021-12-17 DIAGNOSIS — Z0000 Encounter for general adult medical examination without abnormal findings: Secondary | ICD-10-CM | POA: Diagnosis not present

## 2021-12-17 DIAGNOSIS — D709 Neutropenia, unspecified: Secondary | ICD-10-CM | POA: Diagnosis not present

## 2021-12-17 DIAGNOSIS — R5383 Other fatigue: Secondary | ICD-10-CM | POA: Diagnosis not present

## 2021-12-17 DIAGNOSIS — I10 Essential (primary) hypertension: Secondary | ICD-10-CM | POA: Diagnosis not present

## 2021-12-17 DIAGNOSIS — M069 Rheumatoid arthritis, unspecified: Secondary | ICD-10-CM | POA: Diagnosis not present

## 2021-12-18 ENCOUNTER — Telehealth: Payer: Medicare PPO | Admitting: Gastroenterology

## 2021-12-18 ENCOUNTER — Encounter: Payer: Self-pay | Admitting: Gastroenterology

## 2021-12-18 DIAGNOSIS — M6289 Other specified disorders of muscle: Secondary | ICD-10-CM

## 2021-12-18 DIAGNOSIS — K623 Rectal prolapse: Secondary | ICD-10-CM | POA: Diagnosis not present

## 2021-12-18 DIAGNOSIS — K219 Gastro-esophageal reflux disease without esophagitis: Secondary | ICD-10-CM

## 2021-12-18 MED ORDER — PANTOPRAZOLE SODIUM 20 MG PO TBEC: 20.0000 mg | DELAYED_RELEASE_TABLET | Freq: Every day | ORAL | 3 refills | Status: DC

## 2021-12-18 MED ORDER — PANTOPRAZOLE SODIUM 40 MG PO TBEC: 40.0000 mg | DELAYED_RELEASE_TABLET | Freq: Every day | ORAL | 3 refills | Status: DC

## 2021-12-18 MED ORDER — PANTOPRAZOLE SODIUM 20 MG PO TBEC: DELAYED_RELEASE_TABLET | ORAL | 3 refills | Status: DC

## 2021-12-18 NOTE — Progress Notes (Signed)
Sylvia Reynolds    [id number]    22-Apr-1950  Primary Care Physician:Ramachandran, Mauro Kaufmann, MD  Referring Physician: Merrilee Seashore, [address] [place],  [place] [id number]   Chief complaint:  GERD  HPI:  71 year old very pleasant female here for follow-up visit for GERD  Overall her symptoms are improved on pantoprazole.  Denies any dysphagia, odynophagia, vomiting or abdominal pain.  No melena or rectal bleeding.  EGD September 20, 2021 - LA Grade C erosive esophagitis with no bleeding. - Medium-sized hiatal hernia. - Gastritis. Biopsied. - Multiple gastric polyps. Biopsied. - Normal examined duodenum.  Colonoscopy September 20, 2021 - Decreased sphincter tone found on digital rectal exam. - Two 4 to 12 mm polyps in the transverse colon and in the ascending colon, removed with a cold snare. Resected and retrieved. - Non-bleeding external and internal hemorrhoids.   Outpatient Encounter Medications as of 12/18/2021  Medication Sig   Ascorbic Acid (VITAMIN C PO) Take by mouth.   atenolol (TENORMIN) 25 MG tablet Take 25 mg by mouth daily.   benazepril (LOTENSIN) 20 MG tablet Take 20 mg by mouth daily.   calcium citrate-vitamin D (CITRACAL+D) 315-200 MG-UNIT tablet Take 2 tablets by mouth daily.   cholecalciferol (VITAMIN D3) 25 MCG (1000 UNIT) tablet    folic acid (FOLVITE) 1 MG tablet Take 1 mg by mouth daily.   loratadine (CLARITIN) 10 MG tablet Take 10 mg by mouth daily.   Lysine 1000 MG TABS Take 1 tablet by mouth daily.   methotrexate 2.5 MG tablet Take 15 mg by mouth once a week.   Multiple Vitamins-Minerals (MULTI COMPLETE/IRON PO) Take 1 tablet by mouth 2 (two) times daily.   Omega-3 Fatty Acids (FISH OIL) 1200 MG CAPS Take 2 capsules by mouth daily.   pantoprazole (PROTONIX) 40 MG tablet Take 1 tablet (40 mg total) by mouth daily.   polyethylene glycol powder (GLYCOLAX/MIRALAX) 17 GM/SCOOP powder    No facility-administered encounter  medications on file as of 12/18/2021.    Allergies as of 12/18/2021 - Review Complete 12/18/2021  Allergen Reaction Noted   Zithromax [azithromycin] Diarrhea 06/04/2016   Ceftin [cefuroxime axetil] Nausea Only 06/04/2016   Tape Rash 07/01/2018    Past Medical History:  Diagnosis Date   Arthritis    Closed fracture dislocation of right elbow    GERD (gastroesophageal reflux disease)    Hypertension    Osteopenia    Rectal prolapse    Rheumatoid arthritis ([place])    Scoliosis    Shingles    UTI (urinary tract infection)     Past Surgical History:  Procedure Laterality Date   CESAREAN SECTION     COLONOSCOPY     ESOPHAGOGASTRODUODENOSCOPY     ORIF ELBOW FRACTURE Right 07/01/2018   Procedure: OPEN REDUCTION INTERNAL FIXATION (ORIF) RIGHT ELBOW/OLECRANON FRACTURE;  Surgeon: Earlie Server, MD;  Location: [facility];  Service: Orthopedics;  Laterality: Right;    Family History  Problem Relation Age of Onset   Hypertension Mother    Parkinson's disease Mother    Cancer Father        prostate, chronic lymph leukemia   Heart failure Father    Hypertension Father    Hypertension Sister    Cancer Brother        CLL   Kidney Stones Brother    Thyroid disease Maternal Grandfather    Breast cancer Maternal Aunt    Breast cancer  Maternal Aunt    Breast cancer Maternal Aunt    Breast cancer Cousin    Colon cancer Other        1st cousin   Stomach cancer Neg Hx    Esophageal cancer Neg Hx     Social History   Socioeconomic History   Marital status: Married    Spouse name: Not on file   Number of children: 2   Years of education: Not on file   Highest education level: Not on file  Occupational History   Occupation: retired  Tobacco Use   Smoking status: Never   Smokeless tobacco: Never  Vaping Use   Vaping Use: Never used  Substance and Sexual Activity   Alcohol use: Not Currently   Drug use: No   Sexual activity: Not Currently    Partners: Male     Birth control/protection: Post-menopausal    Comment: First sexual encounter at 71 yrs old. Few than 5 partners.  Other Topics Concern   Not on file  Social History Narrative   Not on file   Social Determinants of Health   Financial Resource Strain: Not on file  Food Insecurity: Not on file  Transportation Needs: Not on file  Physical Activity: Not on file  Stress: Not on file  Social Connections: Not on file  Intimate Partner Violence: Not on file      Review of systems: All other review of systems negative except as mentioned in the HPI.   Physical Exam: Vitals:   12/18/21 1035  BP: 110/60  Pulse: (Abnormal) 56   Body mass index is 24.7 kg/m. Gen:      No acute distress HEENT:  sclera anicteric Abd:      soft, non-tender; no palpable masses, no distension Ext:    No edema Neuro: alert and oriented x 3 Psych: normal mood and affect  Data Reviewed:  Reviewed labs, radiology imaging, old records and pertinent past GI work up   Assessment and Plan/Recommendations:  71 year old very pleasant female with GERD and erosive esophagitis Continue daily pantoprazole, will decrease dose to 20 mg daily and antireflux measures  History of adenomatous colon polyps: Due for recall colonoscopy July 2026  Pelvic floor dysfunction and dyssynergy defecation: Refer to pelvic floor physical therapy for biofeedback   The patient was provided an opportunity to ask questions and all were answered. The patient agreed with the plan and demonstrated an understanding of the instructions.  Damaris Hippo , MD    CC: Merrilee Seashore, MD

## 2021-12-18 NOTE — Patient Instructions (Addendum)
_______________________________________________________  If you are age 69 or older, your body mass index should be between 23-30. Your Body mass index is 24.7 kg/m. If this is out of the aforementioned range listed, please consider follow up with your Primary Care Provider. ________________________________________________________  The [facility] providers would like to encourage you to use [facility] to communicate with providers for non-urgent requests or questions.  Due to [facility] hold times on the telephone, sending your provider a message by [facility] may be a faster and more efficient way to get a response.  Please allow 48 business hours for a response.  Please remember that this is for non-urgent requests.  _______________________________________________________  We have sent the following medications to your pharmacy for you to pick up at your convenience:  DECREASE: Pantoprazole '20mg'$  one tablet daily  You have been referred to pelvic floor therapy.  Someone will contact you to get this scheduled.  Thank you for entrusting me with your care and choosing [facility].  Dr Silverio Decamp

## 2021-12-19 ENCOUNTER — Other Ambulatory Visit: Payer: Self-pay | Admitting: Internal Medicine

## 2021-12-19 DIAGNOSIS — N2889 Other specified disorders of kidney and ureter: Secondary | ICD-10-CM | POA: Diagnosis not present

## 2021-12-19 DIAGNOSIS — F4323 Adjustment disorder with mixed anxiety and depressed mood: Secondary | ICD-10-CM | POA: Diagnosis not present

## 2021-12-19 DIAGNOSIS — Z0000 Encounter for general adult medical examination without abnormal findings: Secondary | ICD-10-CM | POA: Diagnosis not present

## 2021-12-19 DIAGNOSIS — Z23 Encounter for immunization: Secondary | ICD-10-CM | POA: Diagnosis not present

## 2021-12-24 DIAGNOSIS — Z0000 Encounter for general adult medical examination without abnormal findings: Secondary | ICD-10-CM | POA: Diagnosis not present

## 2021-12-24 DIAGNOSIS — D709 Neutropenia, unspecified: Secondary | ICD-10-CM | POA: Diagnosis not present

## 2021-12-24 DIAGNOSIS — M810 Age-related osteoporosis without current pathological fracture: Secondary | ICD-10-CM | POA: Diagnosis not present

## 2021-12-24 DIAGNOSIS — E785 Hyperlipidemia, unspecified: Secondary | ICD-10-CM | POA: Diagnosis not present

## 2021-12-24 DIAGNOSIS — M150 Primary generalized (osteo)arthritis: Secondary | ICD-10-CM | POA: Diagnosis not present

## 2021-12-24 DIAGNOSIS — Z23 Encounter for immunization: Secondary | ICD-10-CM | POA: Diagnosis not present

## 2021-12-24 DIAGNOSIS — M069 Rheumatoid arthritis, unspecified: Secondary | ICD-10-CM | POA: Diagnosis not present

## 2021-12-24 DIAGNOSIS — I10 Essential (primary) hypertension: Secondary | ICD-10-CM | POA: Diagnosis not present

## 2021-12-27 ENCOUNTER — Ambulatory Visit
Admission: RE | Admit: 2021-12-27 | Discharge: 2021-12-27 | Disposition: A | Discharge status: Home / Self Care | Payer: Medicare PPO | Source: Ambulatory Visit | Attending: Internal Medicine | Admitting: Internal Medicine

## 2021-12-27 DIAGNOSIS — N2889 Other specified disorders of kidney and ureter: Secondary | ICD-10-CM

## 2021-12-27 DIAGNOSIS — N281 Cyst of kidney, acquired: Secondary | ICD-10-CM | POA: Diagnosis not present

## 2022-01-01 DIAGNOSIS — F4323 Adjustment disorder with mixed anxiety and depressed mood: Secondary | ICD-10-CM | POA: Diagnosis not present

## 2022-01-09 ENCOUNTER — Ambulatory Visit: Payer: Medicare PPO | Attending: Surgery | Admitting: Physical Therapy

## 2022-01-09 DIAGNOSIS — R279 Unspecified lack of coordination: Secondary | ICD-10-CM | POA: Insufficient documentation

## 2022-01-09 DIAGNOSIS — R293 Abnormal posture: Secondary | ICD-10-CM | POA: Diagnosis not present

## 2022-01-09 DIAGNOSIS — M6281 Muscle weakness (generalized): Secondary | ICD-10-CM | POA: Diagnosis not present

## 2022-01-09 NOTE — Therapy (Addendum)
OUTPATIENT PHYSICAL THERAPY FEMALE PELVIC EVALUATION   Patient Name: Sylvia Reynolds MRN: [id number] DOB:[birth year], 71 y.o., female Today's Date: 01/09/2022   PT End of Session - 01/09/22 1018     Visit Number 1    Date for PT Re-Evaluation 04/11/22    Authorization Type [employer] MCR/COHERE    PT Start Time 1017    PT Stop Time 1053    PT Time Calculation (min) 36 min    Activity Tolerance Patient tolerated treatment well    Behavior During Therapy WFL for tasks assessed/performed             Past Medical History:  Diagnosis Date   Arthritis    Closed fracture dislocation of right elbow    GERD (gastroesophageal reflux disease)    Hypertension    Osteopenia    Rectal prolapse    Rheumatoid arthritis ([place])    Scoliosis    Shingles    UTI (urinary tract infection)    Past Surgical History:  Procedure Laterality Date   CESAREAN SECTION     COLONOSCOPY     ESOPHAGOGASTRODUODENOSCOPY     ORIF ELBOW FRACTURE Right 07/01/2018   Procedure: OPEN REDUCTION INTERNAL FIXATION (ORIF) RIGHT ELBOW/OLECRANON FRACTURE;  Surgeon: Earlie Server, MD;  Location: [facility];  Service: Orthopedics;  Laterality: Right;   Patient Active Problem List   Diagnosis Date Noted   Gastroesophageal reflux disease 07/11/2021   Rectal prolapse 07/11/2021   Screening for colon cancer 07/11/2021   RA (rheumatoid arthritis) ([place]) 08/11/2018   Hypertension with normal renal function 08/11/2018    PCP: Merrilee Seashore, MD  REFERRING PROVIDER: Mauri Pole, MD   REFERRING DIAG: M62.89 (ICD-10-CM) - Pelvic floor dysfunction K62.3 (ICD-10-CM) - Rectal prolapse  THERAPY DIAG:  Muscle weakness (generalized)  Unspecified lack of coordination  Abnormal posture  ONSET DATE: 06/02/21  SUBJECTIVE:                                                                                                                                                                                            SUBJECTIVE STATEMENT: Pt reports she now has some extra staff to help take care of daughter and this is now more able to return to PFPT. Pt states she has been doing pelvic floor exercises but not other exercises due to her schedule. Pt reports she has been taking miralax daily and still using step stool and has not had symptoms of prolapse at all. Pt reports she has seen gyn and GI and both agree they didn't see prolapse per pt.     Fluid intake: Yes: 3-4 large glasses  of water, coffee and hot tea but not regularly     Patient confirms identification and approves PT to assess pelvic floor and treatment Yes   PAIN:  Are you having pain? No not at pelvis but does have RA and will have pain from this.    PRECAUTIONS: None  WEIGHT BEARING RESTRICTIONS No  FALLS:  Has patient fallen in last 6 months? 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   PLOF: Independent  PATIENT GOALS to strengthen core/hips and pelvic floor to limit risk or re injury/worsening prolapse or straining pelvic floor   PERTINENT HISTORY:  HTN, scoilosis, RA, osteopenia Sexual abuse: No  BOWEL MOVEMENT Pain with bowel movement: No Type of bowel movement:Type (Bristol Stool Scale) 4, Frequency 1-2x daily, and Strain No using step stool  Fully empty rectum: Yes:   Leakage: No Pads: No Fiber supplement: Yes: Does take miralax    URINATION Pain with urination: No Fully empty bladder: Yes:   Stream: Strong Urgency: Yes: only with waiting longer than a few hours Frequency: at least every 2-3 hours Leakage: Walking to the bathroom not getting there quickly enough - very occasional  Pads: Yes: one per day  INTERCOURSE Pain with intercourse:  not active , not painful when she was active but spouse had prostate CA 12 years ago and has not had intercourse  PREGNANCY Vaginal  deliveries 1(twins with one vaginal and one csection) Tearing Yes: with forceps use and episiotomy  C-section deliveries 1 Currently pregnant No  PROLAPSE Rectocele per chart    OBJECTIVE:   DIAGNOSTIC FINDINGS:    COGNITION:  Overall cognitive status: Within functional limits for tasks assessed     SENSATION:  Light touch: Appears intact  Proprioception: Appears intact  MUSCLE LENGTH: Bil hamstrings and adductors limited by 25%   GAIT: WFL but slight decreased Lt LE step length  POSTURE:   Pt does have scoliosis and with assessment, Lt ribs posteriorly rotated with visual rib hump present in lower back.    LUMBARAROM/PROM  A/PROM A/PROM    Flexion Limited by 25%   Extension Limited by 50%  Right lateral flexion Limited by 25%  Left lateral flexion Limited by 50%  Right rotation Limited by 25%  Left rotation Limited by 25%   (Blank rows = not tested)  LE ROM:  WFL  LE MMT:  Bil hips 4/5; knees and ankles 5/5  PELVIC MMT:  pt deferred internal assessment today as she has recent GYN appt and this was checked.    MMT    Vaginal   Internal Anal Sphincter   External Anal Sphincter   Puborectalis   Diastasis Recti   (Blank rows = not tested)        PALPATION:   General  no TTP                 External Perineal Exam pt deferred                             Internal Pelvic Floor pt deferred  TONE: Pt deferred   PROLAPSE: Pt deferred   TODAY'S TREATMENT  01/09/22 : Examination completed, findings reviewed, pt educated on POC, HEP. Pt motivated to participate in PT and agreeable to attempt recommendations.     PATIENT EDUCATION:  Education details: [id number] Person educated: Patient  Education method: Explanation, Demonstration, Tactile cues, Verbal cues, and Handouts Education comprehension: verbalized understanding and returned demonstration   HOME EXERCISE PROGRAM: [id number]  ASSESSMENT:  CLINICAL IMPRESSION: Patient is a 71 y.o.  female  who was seen today for physical therapy evaluation and treatment for rectal prolapse. See above for details but pt found to have posture deficits due to scoliosis, mild bil hip weakness and core weakness, decreased hip and spinal flexibility, and pt declined internal pelvic floor assessment today as she recently saw GYN and had pelvic exam. Pt reports she would be more open to this later as needed. Pt also has limited rib expansion due to scoliosis. Pt's goal is to strengthen pelvic floor, core and hips to reduce risk of reinjury or strain at pelvic floor/prolapse. Pt would benefit from additional PT to further address deficits.    OBJECTIVE IMPAIRMENTS decreased coordination, decreased endurance, decreased mobility, decreased strength, increased fascial restrictions, impaired flexibility, improper body mechanics, and postural dysfunction.   ACTIVITY LIMITATIONS community activity and caregiver for DTR .   PERSONAL FACTORS Fitness, Time since onset of injury/illness/exacerbation, and 1 comorbidity: medical history  are also affecting patient's functional outcome.    REHAB POTENTIAL: Good  CLINICAL DECISION MAKING: Stable/uncomplicated  EVALUATION COMPLEXITY: Low   GOALS: Goals reviewed with patient? Yes  SHORT TERM GOALS: Target date: 02/06/2022  Pt to be I with HEP.  Baseline: Goal status: INITIAL  2.  Pt to demonstrate improved coordination of pelvic floor and breathing with body weight squat without compensatory strategies to decrease strain at pelvic floor and prolapse.   Baseline:  Goal status: INITIAL  3.  Pt to demonstrate 4/5 pelvic floor strength for improved pelvic stability Baseline:  Goal status: INITIAL    [place] TERM GOALS: Target date: 04/11/22  Pt to be I with advanced HEP.   Baseline:  Goal status: INITIAL  2.  Pt to demonstrate improved coordination of pelvic floor and breathing with 15# squat without compensatory strategies to decrease strain at pelvic  floor and prolapse.   Baseline:  Goal status: INITIAL   3. Pt to demonstrate at least 5/5 bil hip strength for improved pelvic stability .  Baseline:  Goal status: INITIAL  4. Pt to be I with pressure management techniques to reduce risk of re-injury to pelvic floor with caregiver role for adult daughter.    PLAN: PT FREQUENCY: 1x/week  PT DURATION:  6 sessions  PLANNED INTERVENTIONS: Therapeutic exercises, Therapeutic activity, Neuromuscular re-education, Patient/Family education, Joint mobilization, Spinal mobilization, Cryotherapy, Moist heat, Manual lymph drainage, scar mobilization, Taping, Biofeedback, and Manual therapy  PLAN FOR NEXT SESSION: go over breathing and voiding mechanics, coordination of pelvic floor with tasks    Stacy Gardner, PT, DPT 01/09/2310:01 AM

## 2022-01-09 NOTE — Addendum Note (Signed)
Addended by: Junie Panning on: 01/09/2022 11:06 AM   Modules accepted: Orders

## 2022-01-16 DIAGNOSIS — F4323 Adjustment disorder with mixed anxiety and depressed mood: Secondary | ICD-10-CM | POA: Diagnosis not present

## 2022-01-17 ENCOUNTER — Encounter: Payer: Self-pay | Admitting: Gastroenterology

## 2022-01-29 DIAGNOSIS — F4323 Adjustment disorder with mixed anxiety and depressed mood: Secondary | ICD-10-CM | POA: Diagnosis not present

## 2022-01-30 ENCOUNTER — Ambulatory Visit: Payer: Medicare PPO | Admitting: Physical Therapy

## 2022-01-30 DIAGNOSIS — R293 Abnormal posture: Secondary | ICD-10-CM | POA: Diagnosis not present

## 2022-01-30 DIAGNOSIS — M6281 Muscle weakness (generalized): Secondary | ICD-10-CM | POA: Diagnosis not present

## 2022-01-30 DIAGNOSIS — R279 Unspecified lack of coordination: Secondary | ICD-10-CM | POA: Diagnosis not present

## 2022-01-30 NOTE — Therapy (Signed)
OUTPATIENT PHYSICAL THERAPY FEMALE PELVIC TREATMENT   Patient Name: Sylvia Reynolds MRN: [id number] DOB:[birth year], 71 y.o., female Today's Date: 01/30/2022   PT End of Session - 01/30/22 1411     Visit Number 2    Date for PT Re-Evaluation 04/11/22    Authorization Type [employer] MCR/COHERE    PT Start Time 1409   pt arrival time   PT Stop Time 1447    PT Time Calculation (min) 38 min    Activity Tolerance Patient tolerated treatment well    Behavior During Therapy WFL for tasks assessed/performed             Past Medical History:  Diagnosis Date   Arthritis    Closed fracture dislocation of right elbow    GERD (gastroesophageal reflux disease)    Hypertension    Osteopenia    Rectal prolapse    Rheumatoid arthritis ([place])    Scoliosis    Shingles    UTI (urinary tract infection)    Past Surgical History:  Procedure Laterality Date   CESAREAN SECTION     COLONOSCOPY     ESOPHAGOGASTRODUODENOSCOPY     ORIF ELBOW FRACTURE Right 07/01/2018   Procedure: OPEN REDUCTION INTERNAL FIXATION (ORIF) RIGHT ELBOW/OLECRANON FRACTURE;  Surgeon: Earlie Server, MD;  Location: [facility];  Service: Orthopedics;  Laterality: Right;   Patient Active Problem List   Diagnosis Date Noted   Gastroesophageal reflux disease 07/11/2021   Rectal prolapse 07/11/2021   Screening for colon cancer 07/11/2021   RA (rheumatoid arthritis) ([place]) 08/11/2018   Hypertension with normal renal function 08/11/2018    PCP: Merrilee Seashore, MD  REFERRING PROVIDER: Mauri Pole, MD   REFERRING DIAG: M62.89 (ICD-10-CM) - Pelvic floor dysfunction K62.3 (ICD-10-CM) - Rectal prolapse  THERAPY DIAG:  Muscle weakness (generalized)  Abnormal posture  Unspecified lack of coordination  ONSET DATE: 06/02/21  SUBJECTIVE:                                                                                                                                                                                            SUBJECTIVE STATEMENT: Pt reports she has not attempted HEP yet as her daughter's caregiver has been sick and pt has been doing most of care.      Fluid intake: Yes: 3-4 large glasses of water, coffee and hot tea but not regularly     Patient confirms identification and approves PT to assess pelvic floor and treatment Yes   PAIN:  Are you having pain? No not at pelvis but does have RA and will have pain from this.    PRECAUTIONS:  None  WEIGHT BEARING RESTRICTIONS No  FALLS:  Has patient fallen in last 6 months? 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   PLOF: Independent  PATIENT GOALS to strengthen core/hips and pelvic floor to limit risk or re injury/worsening prolapse or straining pelvic floor   PERTINENT HISTORY:  HTN, scoilosis, RA, osteopenia Sexual abuse: No  BOWEL MOVEMENT Pain with bowel movement: No Type of bowel movement:Type (Bristol Stool Scale) 4, Frequency 1-2x daily, and Strain No using step stool  Fully empty rectum: Yes:   Leakage: No Pads: No Fiber supplement: Yes: Does take miralax    URINATION Pain with urination: No Fully empty bladder: Yes:   Stream: Strong Urgency: Yes: only with waiting longer than a few hours Frequency: at least every 2-3 hours Leakage: Walking to the bathroom not getting there quickly enough - very occasional  Pads: Yes: one per day  INTERCOURSE Pain with intercourse:  not active , not painful when she was active but spouse had prostate CA 12 years ago and has not had intercourse  PREGNANCY Vaginal deliveries 1(twins with one vaginal and one csection) Tearing Yes: with forceps use and episiotomy  C-section deliveries 1 Currently pregnant No  PROLAPSE Rectocele per chart    OBJECTIVE:   DIAGNOSTIC FINDINGS:    COGNITION:  Overall cognitive status: Within  functional limits for tasks assessed     SENSATION:  Light touch: Appears intact  Proprioception: Appears intact  MUSCLE LENGTH: Bil hamstrings and adductors limited by 25%   GAIT: WFL but slight decreased Lt LE step length  POSTURE:   Pt does have scoliosis and with assessment, Lt ribs posteriorly rotated with visual rib hump present in lower back.    LUMBARAROM/PROM  A/PROM A/PROM    Flexion Limited by 25%   Extension Limited by 50%  Right lateral flexion Limited by 25%  Left lateral flexion Limited by 50%  Right rotation Limited by 25%  Left rotation Limited by 25%   (Blank rows = not tested)  LE ROM:  WFL  LE MMT:  Bil hips 4/5; knees and ankles 5/5  PELVIC MMT:  pt deferred internal assessment today as she has recent GYN appt and this was checked.    MMT    Vaginal   Internal Anal Sphincter   External Anal Sphincter   Puborectalis   Diastasis Recti   (Blank rows = not tested)        PALPATION:   General  no TTP                 External Perineal Exam pt deferred                             Internal Pelvic Floor pt deferred  TONE: Pt deferred   PROLAPSE: Pt deferred   TODAY'S TREATMENT  01/30/22: NMRE: all exercises cued for breathing mechanics and pelvic floor activation as well as core activation for improved pelvic floor strength and decreased strain at pelvic floor  Diaphragmatic breathing in sitting with max cues for technique to decreased chest breathing and tension at abdomen. Bridges with ball squeezes 2x10 Same hand/knee press 2x10 each Sit to stand from mat table 2x10 Palloffs 2x10 green band     PATIENT EDUCATION:  Education details: [id number] Person educated: Patient Education method: Explanation, Demonstration, Tactile cues, Verbal  cues, and Handouts Education comprehension: verbalized understanding and returned demonstration   HOME EXERCISE PROGRAM: [id number]  ASSESSMENT:  CLINICAL IMPRESSION: Patient has not attempted  HEP yet but plans to this week now that caregiver more available for her daughter. Session focused on review of HEP and pt completing all exercises on HEP to ensure carry over and good technique for home. Pt did benefit from cues for coordination with breathing mechanics. Pt tolerated well.   Pt would benefit from additional PT to further address deficits.    OBJECTIVE IMPAIRMENTS decreased coordination, decreased endurance, decreased mobility, decreased strength, increased fascial restrictions, impaired flexibility, improper body mechanics, and postural dysfunction.   ACTIVITY LIMITATIONS community activity and caregiver for DTR .   PERSONAL FACTORS Fitness, Time since onset of injury/illness/exacerbation, and 1 comorbidity: medical history  are also affecting patient's functional outcome.    REHAB POTENTIAL: Good  CLINICAL DECISION MAKING: Stable/uncomplicated  EVALUATION COMPLEXITY: Low   GOALS: Goals reviewed with patient? Yes  SHORT TERM GOALS: Target date: 02/06/2022  Pt to be I with HEP.  Baseline: Goal status: INITIAL  2.  Pt to demonstrate improved coordination of pelvic floor and breathing with body weight squat without compensatory strategies to decrease strain at pelvic floor and prolapse.   Baseline:  Goal status: INITIAL  3.  Pt to demonstrate 4/5 pelvic floor strength for improved pelvic stability Baseline:  Goal status: INITIAL    [place] TERM GOALS: Target date: 04/11/22  Pt to be I with advanced HEP.   Baseline:  Goal status: INITIAL  2.  Pt to demonstrate improved coordination of pelvic floor and breathing with 15# squat without compensatory strategies to decrease strain at pelvic floor and prolapse.   Baseline:  Goal status: INITIAL   3. Pt to demonstrate at least 5/5 bil hip strength for improved pelvic stability .  Baseline:  Goal status: INITIAL  4. Pt to be I with pressure management techniques to reduce risk of re-injury to pelvic floor with  caregiver role for adult daughter.  Baseline:  Goal status: INITIAL  PLAN: PT FREQUENCY: 1x/week  PT DURATION:  6 sessions  PLANNED INTERVENTIONS: Therapeutic exercises, Therapeutic activity, Neuromuscular re-education, Patient/Family education, Joint mobilization, Spinal mobilization, Cryotherapy, Moist heat, Manual lymph drainage, scar mobilization, Taping, Biofeedback, and Manual therapy  PLAN FOR NEXT SESSION: go over breathing and voiding mechanics, coordination of pelvic floor with tasks    Stacy Gardner, PT, DPT 11/30/233:44 PM

## 2022-02-11 DIAGNOSIS — K623 Rectal prolapse: Secondary | ICD-10-CM | POA: Diagnosis not present

## 2022-02-12 DIAGNOSIS — F4323 Adjustment disorder with mixed anxiety and depressed mood: Secondary | ICD-10-CM | POA: Diagnosis not present

## 2022-02-13 ENCOUNTER — Ambulatory Visit: Payer: Medicare PPO | Attending: Surgery | Admitting: Physical Therapy

## 2022-02-13 DIAGNOSIS — M6281 Muscle weakness (generalized): Secondary | ICD-10-CM | POA: Insufficient documentation

## 2022-02-13 DIAGNOSIS — R293 Abnormal posture: Secondary | ICD-10-CM | POA: Insufficient documentation

## 2022-02-13 DIAGNOSIS — R279 Unspecified lack of coordination: Secondary | ICD-10-CM | POA: Insufficient documentation

## 2022-02-13 NOTE — Therapy (Signed)
OUTPATIENT PHYSICAL THERAPY FEMALE PELVIC TREATMENT   Patient Name: Sylvia Reynolds MRN: [id number] DOB:[birth year], 71 y.o., female Today's Date: 02/13/2022   PT End of Session - 02/13/22 1017     Visit Number 3    Date for PT Re-Evaluation 04/11/22    Authorization Type [employer] MCR/COHERE    PT Start Time 1017    PT Stop Time 1055    PT Time Calculation (min) 38 min    Activity Tolerance Patient tolerated treatment well    Behavior During Therapy WFL for tasks assessed/performed             Past Medical History:  Diagnosis Date   Arthritis    Closed fracture dislocation of right elbow    GERD (gastroesophageal reflux disease)    Hypertension    Osteopenia    Rectal prolapse    Rheumatoid arthritis ([place])    Scoliosis    Shingles    UTI (urinary tract infection)    Past Surgical History:  Procedure Laterality Date   CESAREAN SECTION     COLONOSCOPY     ESOPHAGOGASTRODUODENOSCOPY     ORIF ELBOW FRACTURE Right 07/01/2018   Procedure: OPEN REDUCTION INTERNAL FIXATION (ORIF) RIGHT ELBOW/OLECRANON FRACTURE;  Surgeon: Earlie Server, MD;  Location: [facility];  Service: Orthopedics;  Laterality: Right;   Patient Active Problem List   Diagnosis Date Noted   Gastroesophageal reflux disease 07/11/2021   Rectal prolapse 07/11/2021   Screening for colon cancer 07/11/2021   RA (rheumatoid arthritis) ([place]) 08/11/2018   Hypertension with normal renal function 08/11/2018    PCP: Merrilee Seashore, MD  REFERRING PROVIDER: Mauri Pole, MD   REFERRING DIAG: M62.89 (ICD-10-CM) - Pelvic floor dysfunction K62.3 (ICD-10-CM) - Rectal prolapse  THERAPY DIAG:  Muscle weakness (generalized)  Abnormal posture  Unspecified lack of coordination  ONSET DATE: 06/02/21  SUBJECTIVE:                                                                                                                                                                                            SUBJECTIVE STATEMENT: Pt has been doing HEP and reports she did have couple questions but thinks overall they have been helpful.  Pt reports she did have recent rectal exam with Dr. Dema Severin and reports no rectal prolapse seen.    Fluid intake: Yes: 3-4 large glasses of water, coffee and hot tea but not regularly     Patient confirms identification and approves PT to assess pelvic floor and treatment Yes   PAIN:  Are you having pain? No not at pelvis but does have RA and will  have pain from this.    PRECAUTIONS: None  WEIGHT BEARING RESTRICTIONS No  FALLS:  Has patient fallen in last 6 months? 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   PLOF: Independent  PATIENT GOALS to strengthen core/hips and pelvic floor to limit risk or re injury/worsening prolapse or straining pelvic floor   PERTINENT HISTORY:  HTN, scoilosis, RA, osteopenia Sexual abuse: No  BOWEL MOVEMENT Pain with bowel movement: No Type of bowel movement:Type (Bristol Stool Scale) 4, Frequency 1-2x daily, and Strain No using step stool  Fully empty rectum: Yes:   Leakage: No Pads: No Fiber supplement: Yes: Does take miralax    URINATION Pain with urination: No Fully empty bladder: Yes:   Stream: Strong Urgency: Yes: only with waiting longer than a few hours Frequency: at least every 2-3 hours Leakage: Walking to the bathroom not getting there quickly enough - very occasional  Pads: Yes: one per day  INTERCOURSE Pain with intercourse:  not active , not painful when she was active but spouse had prostate CA 12 years ago and has not had intercourse  PREGNANCY Vaginal deliveries 1(twins with one vaginal and one csection) Tearing Yes: with forceps use and episiotomy  C-section deliveries 1 Currently pregnant No  PROLAPSE Rectocele per chart    OBJECTIVE:   DIAGNOSTIC  FINDINGS:    COGNITION:  Overall cognitive status: Within functional limits for tasks assessed     SENSATION:  Light touch: Appears intact  Proprioception: Appears intact  MUSCLE LENGTH: Bil hamstrings and adductors limited by 25%   GAIT: WFL but slight decreased Lt LE step length  POSTURE:   Pt does have scoliosis and with assessment, Lt ribs posteriorly rotated with visual rib hump present in lower back.    LUMBARAROM/PROM  A/PROM A/PROM    Flexion Limited by 25%   Extension Limited by 50%  Right lateral flexion Limited by 25%  Left lateral flexion Limited by 50%  Right rotation Limited by 25%  Left rotation Limited by 25%   (Blank rows = not tested)  LE ROM:  WFL  LE MMT:  Bil hips 4/5; knees and ankles 5/5  PELVIC MMT:  pt deferred internal assessment today as she has recent GYN appt and this was checked.    MMT    Vaginal   Internal Anal Sphincter   External Anal Sphincter   Puborectalis   Diastasis Recti   (Blank rows = not tested)        PALPATION:   General  no TTP                 External Perineal Exam pt deferred                             Internal Pelvic Floor pt deferred  TONE: Pt deferred   PROLAPSE: Pt deferred   TODAY'S TREATMENT  02/13/22  NMRE: all exercises cued for breathing mechanics and pelvic floor activation as well as core activation for improved pelvic floor strength and decreased strain at pelvic floor  Diaphragmatic breathing in sitting with minimal cues for technique to decreased chest breathing and tension at abdomen improved with minimal tactile cues at abdomen today Seated pelvic floor contractions, improved with towel roll (initially demonstrating glute activation for compensation however with cues and reps improved) 3x10 X10 isometric 8-10s  pelvic floor contractions with towel roll in sitting D66 quick flicks in sitting  Bridges with ball squeezes 2x10 Same hand/knee press 2x10 each Sit to stand from mat table  2x10 Palloffs 2x10 blue band     PATIENT EDUCATION:  Education details: [id number] Person educated: Patient Education method: Explanation, Demonstration, Tactile cues, Verbal cues, and Handouts Education comprehension: verbalized understanding and returned demonstration   HOME EXERCISE PROGRAM: [id number]  ASSESSMENT:  CLINICAL IMPRESSION: Patient session focused on doing HEP exercises as pt wanted to make sure she was doing them correctly. Pt did benefit from minimal cues for coordination with breathing mechanics, less needed than last session. Pt tolerated well but does demonstrate glute compensation with pelvic floor contractions frequently and benefits from cues. Pt would benefit from additional PT to further address deficits.    OBJECTIVE IMPAIRMENTS decreased coordination, decreased endurance, decreased mobility, decreased strength, increased fascial restrictions, impaired flexibility, improper body mechanics, and postural dysfunction.   ACTIVITY LIMITATIONS community activity and caregiver for DTR .   PERSONAL FACTORS Fitness, Time since onset of injury/illness/exacerbation, and 1 comorbidity: medical history  are also affecting patient's functional outcome.    REHAB POTENTIAL: Good  CLINICAL DECISION MAKING: Stable/uncomplicated  EVALUATION COMPLEXITY: Low   GOALS: Goals reviewed with patient? Yes  SHORT TERM GOALS: Target date: 02/06/2022  Pt to be I with HEP.  Baseline: Goal status: INITIAL  2.  Pt to demonstrate improved coordination of pelvic floor and breathing with body weight squat without compensatory strategies to decrease strain at pelvic floor and prolapse.   Baseline:  Goal status: INITIAL  3.  Pt to demonstrate 4/5 pelvic floor strength for improved pelvic stability Baseline:  Goal status: INITIAL    [place] TERM GOALS: Target date: 04/11/22  Pt to be I with advanced HEP.   Baseline:  Goal status: INITIAL  2.  Pt to demonstrate improved  coordination of pelvic floor and breathing with 15# squat without compensatory strategies to decrease strain at pelvic floor and prolapse.   Baseline:  Goal status: INITIAL   3. Pt to demonstrate at least 5/5 bil hip strength for improved pelvic stability .  Baseline:  Goal status: INITIAL  4. Pt to be I with pressure management techniques to reduce risk of re-injury to pelvic floor with caregiver role for adult daughter.  Baseline:  Goal status: INITIAL  PLAN: PT FREQUENCY: 1x/week  PT DURATION:  6 sessions  PLANNED INTERVENTIONS: Therapeutic exercises, Therapeutic activity, Neuromuscular re-education, Patient/Family education, Joint mobilization, Spinal mobilization, Cryotherapy, Moist heat, Manual lymph drainage, scar mobilization, Taping, Biofeedback, and Manual therapy  PLAN FOR NEXT SESSION: go over breathing and voiding mechanics, coordination of pelvic floor with tasks    Stacy Gardner, PT, DPT 02/13/2309:57 AM

## 2022-02-27 DIAGNOSIS — F4323 Adjustment disorder with mixed anxiety and depressed mood: Secondary | ICD-10-CM | POA: Diagnosis not present

## 2022-03-04 ENCOUNTER — Ambulatory Visit: Payer: Medicare PPO | Attending: Surgery | Admitting: Physical Therapy

## 2022-03-04 DIAGNOSIS — R279 Unspecified lack of coordination: Secondary | ICD-10-CM | POA: Insufficient documentation

## 2022-03-04 DIAGNOSIS — R293 Abnormal posture: Secondary | ICD-10-CM | POA: Insufficient documentation

## 2022-03-04 DIAGNOSIS — M6281 Muscle weakness (generalized): Secondary | ICD-10-CM | POA: Insufficient documentation

## 2022-03-04 NOTE — Therapy (Signed)
OUTPATIENT PHYSICAL THERAPY FEMALE PELVIC TREATMENT   Patient Name: Sylvia Reynolds MRN: [id number] DOB:[birth year], 72 y.o., female Today's Date: 03/04/2022   PT End of Session - 03/04/22 1412     Visit Number 4    Date for PT Re-Evaluation 04/11/22    Authorization Type [employer] MCR/COHERE    PT Start Time 1408   pt arrival time   PT Stop Time 1440    PT Time Calculation (min) 32 min    Activity Tolerance Patient tolerated treatment well    Behavior During Therapy WFL for tasks assessed/performed             Past Medical History:  Diagnosis Date   Arthritis    Closed fracture dislocation of right elbow    GERD (gastroesophageal reflux disease)    Hypertension    Osteopenia    Rectal prolapse    Rheumatoid arthritis ([place])    Scoliosis    Shingles    UTI (urinary tract infection)    Past Surgical History:  Procedure Laterality Date   CESAREAN SECTION     COLONOSCOPY     ESOPHAGOGASTRODUODENOSCOPY     ORIF ELBOW FRACTURE Right 07/01/2018   Procedure: OPEN REDUCTION INTERNAL FIXATION (ORIF) RIGHT ELBOW/OLECRANON FRACTURE;  Surgeon: Earlie Server, MD;  Location: [facility];  Service: Orthopedics;  Laterality: Right;   Patient Active Problem List   Diagnosis Date Noted   Gastroesophageal reflux disease 07/11/2021   Rectal prolapse 07/11/2021   Screening for colon cancer 07/11/2021   RA (rheumatoid arthritis) ([place]) 08/11/2018   Hypertension with normal renal function 08/11/2018    PCP: Merrilee Seashore, MD  REFERRING PROVIDER: Mauri Pole, MD   REFERRING DIAG: M62.89 (ICD-10-CM) - Pelvic floor dysfunction K62.3 (ICD-10-CM) - Rectal prolapse  THERAPY DIAG:  Muscle weakness (generalized)  Unspecified lack of coordination  Abnormal posture  ONSET DATE: 06/02/21  SUBJECTIVE:                                                                                                                                                                                            SUBJECTIVE STATEMENT: Pt report she has been doing HEP, did have few days around Christmas where she was unable but overall has been doing them. Pt reports she has not had any issues with the prolapse. Pt reports she has been in a "little bit of a rheumatoid flare" the past couple of days and has been sore with activity on Rt side.     Fluid intake: Yes: 3-4 large glasses of water, coffee and hot tea but not regularly     Patient confirms  identification and approves PT to assess pelvic floor and treatment Yes   PAIN:  Are you having pain? No not at pelvis but does have RA and will have pain from this.    PRECAUTIONS: None  WEIGHT BEARING RESTRICTIONS No  FALLS:  Has patient fallen in last 6 months? 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   PLOF: Independent  PATIENT GOALS to strengthen core/hips and pelvic floor to limit risk or re injury/worsening prolapse or straining pelvic floor   PERTINENT HISTORY:  HTN, scoilosis, RA, osteopenia Sexual abuse: No  BOWEL MOVEMENT Pain with bowel movement: No Type of bowel movement:Type (Bristol Stool Scale) 4, Frequency 1-2x daily, and Strain No using step stool  Fully empty rectum: Yes:   Leakage: No Pads: No Fiber supplement: Yes: Does take miralax    URINATION Pain with urination: No Fully empty bladder: Yes:   Stream: Strong Urgency: Yes: only with waiting longer than a few hours Frequency: at least every 2-3 hours Leakage: Walking to the bathroom not getting there quickly enough - very occasional  Pads: Yes: one per day  INTERCOURSE Pain with intercourse:  not active , not painful when she was active but spouse had prostate CA 12 years ago and has not had intercourse  PREGNANCY Vaginal deliveries 1(twins with one vaginal and one csection) Tearing Yes: with forceps use and  episiotomy  C-section deliveries 1 Currently pregnant No  PROLAPSE Rectocele per chart    OBJECTIVE:   DIAGNOSTIC FINDINGS:    COGNITION:  Overall cognitive status: Within functional limits for tasks assessed     SENSATION:  Light touch: Appears intact  Proprioception: Appears intact  MUSCLE LENGTH: Bil hamstrings and adductors limited by 25%   GAIT: WFL but slight decreased Lt LE step length  POSTURE:   Pt does have scoliosis and with assessment, Lt ribs posteriorly rotated with visual rib hump present in lower back.    LUMBARAROM/PROM  A/PROM A/PROM    Flexion Limited by 25%   Extension Limited by 50%  Right lateral flexion Limited by 25%  Left lateral flexion Limited by 50%  Right rotation Limited by 25%  Left rotation Limited by 25%   (Blank rows = not tested)  LE ROM:  WFL  LE MMT:  Bil hips 4/5; knees and ankles 5/5  PELVIC MMT:  pt deferred internal assessment today as she has recent GYN appt and this was checked.    MMT    Vaginal   Internal Anal Sphincter   External Anal Sphincter   Puborectalis   Diastasis Recti   (Blank rows = not tested)        PALPATION:   General  no TTP                 External Perineal Exam pt deferred                             Internal Pelvic Floor pt deferred  TONE: Pt deferred   PROLAPSE: Pt deferred   TODAY'S TREATMENT   03/04/22: Pt educated on abdominal massage for improved peristalsis to improve any constipation symptoms as needed.   NMRE: all exercises cued for breathing mechanics and pelvic floor activation as well as core activation for improved pelvic floor strength and decreased strain at pelvic floor  X10 isometric 8-10s  pelvic floor contractions with towel roll in sitting E45 quick flicks in sitting  Palloffs 2x10 green band - pt had questions about this one for carry over with good technique at home.     PATIENT EDUCATION:  Education details: [id number] Person educated:  Patient Education method: Explanation, Demonstration, Tactile cues, Verbal cues, and Handouts Education comprehension: verbalized understanding and returned demonstration   HOME EXERCISE PROGRAM: [id number]  ASSESSMENT:  CLINICAL IMPRESSION: Patient session focused on doing core strengthening as pt wanted to make sure she was doing them correctly at home. Pt did benefit from minimal cues for coordination with breathing mechanics, even less needed than last session. Pt tolerated well but does demonstrate glute compensation with pelvic floor contractions frequently and benefits from cues minimally, improved compared to last session. Pt reports she feels much better about all HEP, has been doing breathing mechanics and implementing lifting mechanics with home needs and caregiver needs and this has helped a lot. Pt confident and agreeable to today being DC from PT and denies additional needs from PT at this time post treatment. All goals met except internal strength goal as pt decline this today.    OBJECTIVE IMPAIRMENTS decreased coordination, decreased endurance, decreased mobility, decreased strength, increased fascial restrictions, impaired flexibility, improper body mechanics, and postural dysfunction.   ACTIVITY LIMITATIONS community activity and caregiver for DTR .   PERSONAL FACTORS Fitness, Time since onset of injury/illness/exacerbation, and 1 comorbidity: medical history  are also affecting patient's functional outcome.    REHAB POTENTIAL: Good  CLINICAL DECISION MAKING: Stable/uncomplicated  EVALUATION COMPLEXITY: Low   GOALS: Goals reviewed with patient? Yes  SHORT TERM GOALS: Target date: 02/06/2022  Pt to be I with HEP.  Baseline: Goal status: MET  2.  Pt to demonstrate improved coordination of pelvic floor and breathing with body weight squat without compensatory strategies to decrease strain at pelvic floor and prolapse.   Baseline:  Goal status: MET  3.  Pt to  demonstrate 4/5 pelvic floor strength for improved pelvic stability Baseline:  Goal status: pt decline internal assessment this POC, did have recent exam from MD and no prolapse seen per pt.     [place] TERM GOALS: Target date: 04/11/22  Pt to be I with advanced HEP.   Baseline:  Goal status: MET  2.  Pt to demonstrate improved coordination of pelvic floor and breathing with 15# squat without compensatory strategies to decrease strain at pelvic floor and prolapse.   Baseline:  Goal status: INITIAL   3. Pt to demonstrate at least 5/5 bil hip strength for improved pelvic stability .  Baseline:  Goal status: MET  4. Pt to be I with pressure management techniques to reduce risk of re-injury to pelvic floor with caregiver role for adult daughter.  Baseline:  Goal status: MET  PLAN: PT FREQUENCY: 1x/week  PT DURATION:  6 sessions  PLANNED INTERVENTIONS: Therapeutic exercises, Therapeutic activity, Neuromuscular re-education, Patient/Family education, Joint mobilization, Spinal mobilization, Cryotherapy, Moist heat, Manual lymph drainage, scar mobilization, Taping, Biofeedback, and Manual therapy  PLAN FOR NEXT SESSION: go over breathing and voiding mechanics, coordination of pelvic floor with tasks   PHYSICAL THERAPY DISCHARGE SUMMARY  Visits from Start of Care: 4  Current functional level related to goals / functional outcomes: All goals met except internal strength goal as pt declined strength assessment as she recently had MD appointment and had no prolapse concerns.    Remaining deficits: All goals met   Education / Equipment: HEP   Patient  agrees to discharge. Patient goals were met. Patient is being discharged due to being pleased with the current functional level.   Stacy Gardner, PT, DPT 01/02/242:42 PM

## 2022-03-13 DIAGNOSIS — F4323 Adjustment disorder with mixed anxiety and depressed mood: Secondary | ICD-10-CM | POA: Diagnosis not present

## 2022-03-18 ENCOUNTER — Ambulatory Visit: Payer: Medicare PPO

## 2022-03-25 DIAGNOSIS — I10 Essential (primary) hypertension: Secondary | ICD-10-CM | POA: Diagnosis not present

## 2022-03-25 DIAGNOSIS — E785 Hyperlipidemia, unspecified: Secondary | ICD-10-CM | POA: Diagnosis not present

## 2022-03-25 DIAGNOSIS — M069 Rheumatoid arthritis, unspecified: Secondary | ICD-10-CM | POA: Diagnosis not present

## 2022-03-25 DIAGNOSIS — D709 Neutropenia, unspecified: Secondary | ICD-10-CM | POA: Diagnosis not present

## 2022-03-25 DIAGNOSIS — Z0000 Encounter for general adult medical examination without abnormal findings: Secondary | ICD-10-CM | POA: Diagnosis not present

## 2022-03-25 DIAGNOSIS — M150 Primary generalized (osteo)arthritis: Secondary | ICD-10-CM | POA: Diagnosis not present

## 2022-03-25 DIAGNOSIS — M810 Age-related osteoporosis without current pathological fracture: Secondary | ICD-10-CM | POA: Diagnosis not present

## 2022-03-27 DIAGNOSIS — F4323 Adjustment disorder with mixed anxiety and depressed mood: Secondary | ICD-10-CM | POA: Diagnosis not present

## 2022-04-01 DIAGNOSIS — E785 Hyperlipidemia, unspecified: Secondary | ICD-10-CM | POA: Diagnosis not present

## 2022-04-01 DIAGNOSIS — D709 Neutropenia, unspecified: Secondary | ICD-10-CM | POA: Diagnosis not present

## 2022-04-01 DIAGNOSIS — I10 Essential (primary) hypertension: Secondary | ICD-10-CM | POA: Diagnosis not present

## 2022-04-01 DIAGNOSIS — M069 Rheumatoid arthritis, unspecified: Secondary | ICD-10-CM | POA: Diagnosis not present

## 2022-04-03 ENCOUNTER — Telehealth: Payer: Self-pay | Admitting: Gastroenterology

## 2022-04-03 DIAGNOSIS — K219 Gastro-esophageal reflux disease without esophagitis: Secondary | ICD-10-CM

## 2022-04-03 NOTE — Telephone Encounter (Signed)
Delphina from [facility] is calling about the RX pantoprazole. It was written for 30/90 and it can only be for 30/30. The quantity is overlimit. A new RX needs to be submitted to [place] [facility] for prior authorization. [phone]; fax [phone]. Please advise.

## 2022-04-03 NOTE — Telephone Encounter (Signed)
Patient was last seen by Dr. Silverio Decamp

## 2022-04-04 NOTE — Telephone Encounter (Signed)
Dr Silverio Decamp I will have to submit 40 mg tablet for pt for once daily qty 30 before insurance will cover this for pt

## 2022-04-07 ENCOUNTER — Telehealth: Payer: Self-pay

## 2022-04-07 ENCOUNTER — Encounter: Payer: Self-pay | Admitting: Gastroenterology

## 2022-04-07 NOTE — Telephone Encounter (Signed)
Patient has been taking Pantoprazole 20 mg daily and occasionally takes an additional table in the evening. This happens "about 3 times a week since I started in the 20 mg strength." Insurance has denied the prescription as it is written. Please review the SIG on the prescription.  Please advise on the pantoprazole. Do you want it written as BID?

## 2022-04-08 ENCOUNTER — Other Ambulatory Visit: Payer: Self-pay

## 2022-04-08 DIAGNOSIS — K219 Gastro-esophageal reflux disease without esophagitis: Secondary | ICD-10-CM

## 2022-04-08 MED ORDER — PANTOPRAZOLE SODIUM 20 MG PO TBEC: DELAYED_RELEASE_TABLET | ORAL | 3 refills | Status: DC

## 2022-04-08 NOTE — Telephone Encounter (Signed)
Please send her new prescription for pantoprazole 20 mg twice daily, please advise her to take the evening dose every other day and take morning dose daily but if she is having frequent breakthrough GERD episodes she needs to stay on 20 mg twice daily.  Thank you

## 2022-04-09 DIAGNOSIS — M79671 Pain in right foot: Secondary | ICD-10-CM | POA: Diagnosis not present

## 2022-04-09 DIAGNOSIS — Z6824 Body mass index (BMI) 24.0-24.9, adult: Secondary | ICD-10-CM | POA: Diagnosis not present

## 2022-04-09 DIAGNOSIS — M1991 Primary osteoarthritis, unspecified site: Secondary | ICD-10-CM | POA: Diagnosis not present

## 2022-04-09 DIAGNOSIS — M8580 Other specified disorders of bone density and structure, unspecified site: Secondary | ICD-10-CM | POA: Diagnosis not present

## 2022-04-09 DIAGNOSIS — M25551 Pain in right hip: Secondary | ICD-10-CM | POA: Diagnosis not present

## 2022-04-09 DIAGNOSIS — M0589 Other rheumatoid arthritis with rheumatoid factor of multiple sites: Secondary | ICD-10-CM | POA: Diagnosis not present

## 2022-04-10 DIAGNOSIS — F4323 Adjustment disorder with mixed anxiety and depressed mood: Secondary | ICD-10-CM | POA: Diagnosis not present

## 2022-04-11 ENCOUNTER — Other Ambulatory Visit: Payer: Self-pay

## 2022-04-11 DIAGNOSIS — K219 Gastro-esophageal reflux disease without esophagitis: Secondary | ICD-10-CM

## 2022-04-11 MED ORDER — PANTOPRAZOLE SODIUM 20 MG PO TBEC: DELAYED_RELEASE_TABLET | ORAL | 6 refills | Status: DC

## 2022-04-15 ENCOUNTER — Encounter (HOSPITAL_COMMUNITY): Payer: Self-pay

## 2022-04-15 ENCOUNTER — Ambulatory Visit (INDEPENDENT_AMBULATORY_CARE_PROVIDER_SITE_OTHER): Payer: Medicare PPO

## 2022-04-15 ENCOUNTER — Ambulatory Visit (HOSPITAL_COMMUNITY)
Admission: EM | Admit: 2022-04-15 | Discharge: 2022-04-15 | Disposition: A | Discharge status: Home / Self Care | Payer: Medicare PPO

## 2022-04-15 DIAGNOSIS — M25571 Pain in right ankle and joints of right foot: Secondary | ICD-10-CM | POA: Diagnosis not present

## 2022-04-15 DIAGNOSIS — Z23 Encounter for immunization: Secondary | ICD-10-CM

## 2022-04-15 DIAGNOSIS — S90511A Abrasion, right ankle, initial encounter: Secondary | ICD-10-CM | POA: Diagnosis not present

## 2022-04-15 DIAGNOSIS — S99911A Unspecified injury of right ankle, initial encounter: Secondary | ICD-10-CM | POA: Diagnosis not present

## 2022-04-15 DIAGNOSIS — S99921A Unspecified injury of right foot, initial encounter: Secondary | ICD-10-CM

## 2022-04-15 MED ORDER — TETANUS-DIPHTH-ACELL PERTUSSIS 5-2.5-18.5 LF-MCG/0.5 IM SUSY
0.5000 mL | PREFILLED_SYRINGE | Freq: Once | INTRAMUSCULAR | Status: AC
  Administered 2022-04-15: 0.5 mL via INTRAMUSCULAR

## 2022-04-15 MED ORDER — TETANUS-DIPHTH-ACELL PERTUSSIS 5-2.5-18.5 LF-MCG/0.5 IM SUSY
PREFILLED_SYRINGE | INTRAMUSCULAR | Status: AC
  Filled 2022-04-15: qty 0.5

## 2022-04-15 NOTE — ED Triage Notes (Signed)
Pt presents with injury to right foot. Pts daughters wheelchair ran into the back of her foot twice. Pt does have a scrape to the back of her right ankle. This happened today at 5pm.

## 2022-04-15 NOTE — Discharge Instructions (Addendum)
Your tetanus was updated in the urgent care today. Your xray of right ankle is negative for fracture. Keep right ankle clean,dry. Apply topical antibiotic ointment and dressing to the area. Ice,elevate, may use ace wrap for comfort.

## 2022-04-15 NOTE — ED Provider Notes (Signed)
[facility]    CSN: [id number] Arrival date & time: 04/15/22  1757      History   Chief Complaint Chief Complaint  Patient presents with   Foot Pain    HPI TATAYANA RASSO is a 72 y.o. female.   72 year old female pt, Milliani Jory, presents to urgent care with complaint of right foot injury today, ran into back of foot with wheelchair today~1700.   The history is provided by the patient. No language interpreter was used.    Past Medical History:  Diagnosis Date   Arthritis    Closed fracture dislocation of right elbow    GERD (gastroesophageal reflux disease)    Hypertension    Osteopenia    Rectal prolapse    Rheumatoid arthritis ([place])    Scoliosis    Shingles    UTI (urinary tract infection)     Patient Active Problem List   Diagnosis Date Noted   Injury of right foot 04/15/2022   Abrasion of right ankle 04/15/2022   Need for tetanus booster 04/15/2022   Gastroesophageal reflux disease 07/11/2021   Rectal prolapse 07/11/2021   Screening for colon cancer 07/11/2021   RA (rheumatoid arthritis) ([place]) 08/11/2018   Hypertension with normal renal function 08/11/2018    Past Surgical History:  Procedure Laterality Date   CESAREAN SECTION     COLONOSCOPY     ESOPHAGOGASTRODUODENOSCOPY     ORIF ELBOW FRACTURE Right 07/01/2018   Procedure: OPEN REDUCTION INTERNAL FIXATION (ORIF) RIGHT ELBOW/OLECRANON FRACTURE;  Surgeon: Earlie Server, MD;  Location: [facility];  Service: Orthopedics;  Laterality: Right;    OB History     Gravida  1   Para  1   Term      Preterm  1   AB      Living  2      SAB      IAB      Ectopic      Multiple  1   Live Births  2            Home Medications    Prior to Admission medications   Medication Sig Start Date End Date Taking? Authorizing Provider  rosuvastatin (CRESTOR) 5 MG tablet Take 5 mg by mouth daily.   Yes [provider]  Ascorbic Acid (VITAMIN C PO) Take by mouth.     [provider]  atenolol (TENORMIN) 25 MG tablet Take 25 mg by mouth daily.    [provider]  benazepril (LOTENSIN) 20 MG tablet Take 20 mg by mouth daily.    [provider]  calcium citrate-vitamin D (CITRACAL+D) 315-200 MG-UNIT tablet Take 2 tablets by mouth daily.    [provider]  cholecalciferol (VITAMIN D3) 25 MCG (1000 UNIT) tablet     [provider]  folic acid (FOLVITE) 1 MG tablet Take 1 mg by mouth daily.    [provider]  loratadine (CLARITIN) 10 MG tablet Take 10 mg by mouth daily.    [provider]  Lysine 1000 MG TABS Take 1 tablet by mouth daily.    [provider]  methotrexate 2.5 MG tablet Take 15 mg by mouth once a week.    [provider]  Multiple Vitamins-Minerals (MULTI COMPLETE/IRON PO) Take 1 tablet by mouth 2 (two) times daily.    [provider]  Omega-3 Fatty Acids (FISH OIL) 1200 MG CAPS Take 2 capsules by mouth daily.    [provider]  pantoprazole (PROTONIX) 20 MG tablet Take 1 tablet in the morning and 1 tablet in the evening as needed. 04/11/22   Mauri Pole, MD  polyethylene glycol powder (GLYCOLAX/MIRALAX) 17 GM/SCOOP powder  06/06/21   [provider]    Family History Family History  Problem Relation Age of Onset   Hypertension Mother    Parkinson's disease Mother    Cancer Father        prostate, chronic lymph leukemia   Heart failure Father    Hypertension Father    Hypertension Sister    Cancer Brother        CLL   Kidney Stones Brother    Thyroid disease Maternal Grandfather    Breast cancer Maternal Aunt    Breast cancer Maternal Aunt    Breast cancer Maternal Aunt    Breast cancer Cousin    Colon cancer Other        1st cousin   Stomach cancer Neg Hx    Esophageal cancer Neg Hx     Social History Social History   Tobacco Use   Smoking status: Never   Smokeless tobacco: Never  Vaping Use   Vaping Use: Never  used  Substance Use Topics   Alcohol use: Not Currently   Drug use: No     Allergies   Zithromax [azithromycin], Ceftin [cefuroxime axetil], and Tape   Review of Systems Review of Systems  Musculoskeletal:  Positive for arthralgias, gait problem and myalgias.  Skin:  Positive for wound.  All other systems reviewed and are negative.    Physical Exam Triage Vital Signs ED Triage Vitals [04/15/22 1908]  Enc Vitals Group     BP      Pulse      Resp      Temp      Temp src      SpO2      Weight      Height      Head Circumference      Peak Flow      Pain Score 4     Pain Loc      Pain Edu?      Excl. in [place]?    No data found.  Updated Vital Signs BP (!) 186/80 Comment: reports feeling anxious  Pulse 63   Temp 98 F (36.7 C)   Resp 18   SpO2 98%   Visual Acuity Right Eye Distance:   Left Eye Distance:   Bilateral Distance:    Right Eye Near:   Left Eye Near:    Bilateral Near:     Physical Exam Vitals and nursing note reviewed.  Constitutional:      General: She is not in acute distress.    Appearance: She is well-developed.  HENT:     Head: Normocephalic and atraumatic.  Eyes:     Conjunctiva/sclera: Conjunctivae normal.  Cardiovascular:     Rate and Rhythm: Normal rate and regular rhythm.     Pulses:          Dorsalis pedis pulses are 2+ on the right side.     Heart sounds: No murmur heard. Pulmonary:     Effort: Pulmonary effort is normal. No respiratory distress.     Breath sounds: Normal breath sounds.  Abdominal:     Palpations: Abdomen is soft.     Tenderness: There is no abdominal tenderness.  Musculoskeletal:        General: No swelling.  Cervical back: Neck supple.       Feet:  Skin:    General: Skin is warm and dry.     Capillary Refill: Capillary refill takes less than 2 seconds.  Neurological:     Mental Status: She is alert.  Psychiatric:        Mood and Affect: Mood normal.      UC Treatments / Results   Labs (all labs ordered are listed, but only abnormal results are displayed) Labs Reviewed - No data to display  EKG   Radiology DG Ankle Complete Right  Result Date: 04/15/2022 CLINICAL DATA:  Injury EXAM: RIGHT ANKLE - COMPLETE 3+ VIEW COMPARISON:  None Available. FINDINGS: There is no evidence of fracture, dislocation, or joint effusion. There is no evidence of arthropathy or other focal bone abnormality. There is lateral soft tissue swelling. IMPRESSION: Lateral soft tissue swelling. No acute fracture or dislocation. Electronically Signed   By: Ronney Asters M.D.   On: 04/15/2022 19:33    Procedures Procedures (including critical care time)  Medications Ordered in UC Medications  Tdap (BOOSTRIX) injection 0.5 mL (0.5 mLs Intramuscular Given 04/15/22 1933)    Initial Impression / Assessment and Plan / UC Course  I have reviewed the triage vital signs and the nursing notes.  Pertinent labs & imaging results that were available during my care of the patient were reviewed by me and considered in my medical decision making (see chart for details).     Ddx: Right foot contusion,abrasion,fracture Final Clinical Impressions(s) / UC Diagnoses   Final diagnoses:  Injury of right foot, initial encounter  Abrasion of right ankle, initial encounter  Need for tetanus booster     Discharge Instructions      Your tetanus was updated in the urgent care today. Your xray of right ankle is negative for fracture. Keep right ankle clean,dry. Apply topical antibiotic ointment and dressing to the area. Ice,elevate, may use ace wrap for comfort.     ED Prescriptions   None    PDMP not reviewed this encounter.   Tori Milks, NP [id number]

## 2022-04-17 ENCOUNTER — Ambulatory Visit (HOSPITAL_COMMUNITY): Payer: Medicare PPO

## 2022-04-17 DIAGNOSIS — H5203 Hypermetropia, bilateral: Secondary | ICD-10-CM | POA: Diagnosis not present

## 2022-04-17 DIAGNOSIS — H2513 Age-related nuclear cataract, bilateral: Secondary | ICD-10-CM | POA: Diagnosis not present

## 2022-04-17 DIAGNOSIS — H524 Presbyopia: Secondary | ICD-10-CM | POA: Diagnosis not present

## 2022-04-17 NOTE — Telephone Encounter (Signed)
Patient aware.

## 2022-04-24 DIAGNOSIS — F4323 Adjustment disorder with mixed anxiety and depressed mood: Secondary | ICD-10-CM | POA: Diagnosis not present

## 2022-05-08 DIAGNOSIS — F4323 Adjustment disorder with mixed anxiety and depressed mood: Secondary | ICD-10-CM | POA: Diagnosis not present

## 2022-05-22 DIAGNOSIS — F4323 Adjustment disorder with mixed anxiety and depressed mood: Secondary | ICD-10-CM | POA: Diagnosis not present

## 2022-06-04 DIAGNOSIS — F4323 Adjustment disorder with mixed anxiety and depressed mood: Secondary | ICD-10-CM | POA: Diagnosis not present

## 2022-06-17 DIAGNOSIS — F4323 Adjustment disorder with mixed anxiety and depressed mood: Secondary | ICD-10-CM | POA: Diagnosis not present

## 2022-07-03 DIAGNOSIS — F4323 Adjustment disorder with mixed anxiety and depressed mood: Secondary | ICD-10-CM | POA: Diagnosis not present

## 2022-07-17 DIAGNOSIS — F4323 Adjustment disorder with mixed anxiety and depressed mood: Secondary | ICD-10-CM | POA: Diagnosis not present

## 2022-07-23 DIAGNOSIS — I10 Essential (primary) hypertension: Secondary | ICD-10-CM | POA: Diagnosis not present

## 2022-07-23 DIAGNOSIS — E785 Hyperlipidemia, unspecified: Secondary | ICD-10-CM | POA: Diagnosis not present

## 2022-07-30 DIAGNOSIS — M150 Primary generalized (osteo)arthritis: Secondary | ICD-10-CM | POA: Diagnosis not present

## 2022-07-30 DIAGNOSIS — D709 Neutropenia, unspecified: Secondary | ICD-10-CM | POA: Diagnosis not present

## 2022-07-30 DIAGNOSIS — I10 Essential (primary) hypertension: Secondary | ICD-10-CM | POA: Diagnosis not present

## 2022-07-30 DIAGNOSIS — E785 Hyperlipidemia, unspecified: Secondary | ICD-10-CM | POA: Diagnosis not present

## 2022-07-30 DIAGNOSIS — M069 Rheumatoid arthritis, unspecified: Secondary | ICD-10-CM | POA: Diagnosis not present

## 2022-07-30 DIAGNOSIS — M810 Age-related osteoporosis without current pathological fracture: Secondary | ICD-10-CM | POA: Diagnosis not present

## 2022-07-31 DIAGNOSIS — F4323 Adjustment disorder with mixed anxiety and depressed mood: Secondary | ICD-10-CM | POA: Diagnosis not present

## 2022-08-14 DIAGNOSIS — F4323 Adjustment disorder with mixed anxiety and depressed mood: Secondary | ICD-10-CM | POA: Diagnosis not present

## 2022-08-28 DIAGNOSIS — F4323 Adjustment disorder with mixed anxiety and depressed mood: Secondary | ICD-10-CM | POA: Diagnosis not present

## 2022-09-11 DIAGNOSIS — F4323 Adjustment disorder with mixed anxiety and depressed mood: Secondary | ICD-10-CM | POA: Diagnosis not present

## 2022-09-12 ENCOUNTER — Telehealth: Payer: Self-pay

## 2022-09-12 ENCOUNTER — Ambulatory Visit: Payer: Medicare PPO | Admitting: Nurse Practitioner

## 2022-09-12 ENCOUNTER — Encounter: Payer: Self-pay | Admitting: Nurse Practitioner

## 2022-09-12 VITALS — BP 134/82 | HR 64 | Temp 98.6°F | Ht 58.5 in | Wt 122.0 lb

## 2022-09-12 DIAGNOSIS — M419 Scoliosis, unspecified: Secondary | ICD-10-CM | POA: Insufficient documentation

## 2022-09-12 DIAGNOSIS — I10 Essential (primary) hypertension: Secondary | ICD-10-CM | POA: Diagnosis not present

## 2022-09-12 DIAGNOSIS — Z730 Burn-out: Secondary | ICD-10-CM

## 2022-09-12 NOTE — Telephone Encounter (Signed)
MyChart message sent to patient.

## 2022-09-12 NOTE — Assessment & Plan Note (Signed)
Chronic Refer to care management to see if there are any additional social services that can be utilized to assist with her caregiving responsibilities.

## 2022-09-12 NOTE — Telephone Encounter (Signed)
Pt stated it been a month since she eat this ice cream which was recalled about a week, recalled was in regard the ice cream having listeria in and she is wondering is there a particular test that she can do to see she does not have it and what she need to look out for.

## 2022-09-12 NOTE — Assessment & Plan Note (Signed)
Chronic, stable Blood pressure reasonable on current regimen. Continue atenolol 25 mg daily, benazepril 20 mg daily

## 2022-09-12 NOTE — Progress Notes (Signed)
New Patient Office Visit  Subjective    Patient ID: Sylvia Reynolds, female    DOB: Jan 24, 1951  Age: 72 y.o. MRN: [id number]  CC:  Chief Complaint  Patient presents with   Burnout    HPI Sylvia Reynolds presents to establish care She is transitioning to this office for primary care needs.  Overall she feels at her baseline and has no significant acute complaints.  Reports she had labs completed by her previous PCP recently.  We will request records to review. She does experience some fatigue, possibly mild depression related to the significant emotional and physical demands that she is under regarding helping to care for her daughter who has cerebral palsy.  She is looking for additional resources to assist her with this.  She does report that daughter qualifies for greater than 80 hours of personal care per week, however often this work falls on Sylvia Reynolds and her husband which can be tiring.  Outpatient Encounter Medications as of 09/12/2022  Medication Sig   Ascorbic Acid (VITAMIN C PO) Take by mouth.   atenolol (TENORMIN) 25 MG tablet Take 25 mg by mouth daily.   benazepril (LOTENSIN) 20 MG tablet Take 20 mg by mouth daily.   calcium citrate-vitamin D (CITRACAL+D) 315-200 MG-UNIT tablet Take 2 tablets by mouth daily.   cholecalciferol (VITAMIN D3) 25 MCG (1000 UNIT) tablet    folic acid (FOLVITE) 1 MG tablet Take 1 mg by mouth daily.   loratadine (CLARITIN) 10 MG tablet Take 10 mg by mouth daily.   Lysine 1000 MG TABS Take 1 tablet by mouth daily.   methotrexate 2.5 MG tablet Take 15 mg by mouth once a week.   Multiple Vitamins-Minerals (MULTI COMPLETE/IRON PO) Take 1 tablet by mouth 2 (two) times daily.   Omega-3 Fatty Acids (FISH OIL) 1200 MG CAPS Take 2 capsules by mouth daily.   pantoprazole (PROTONIX) 20 MG tablet Take 1 tablet in the morning and 1 tablet in the evening as needed.   polyethylene glycol powder (GLYCOLAX/MIRALAX) 17 GM/SCOOP powder    rosuvastatin (CRESTOR) 20 MG tablet  Take 20 mg by mouth daily.   [DISCONTINUED] rosuvastatin (CRESTOR) 10 MG tablet Take 20 mg by mouth daily.   [DISCONTINUED] rosuvastatin (CRESTOR) 5 MG tablet Take 20 mg by mouth daily.   No facility-administered encounter medications on file as of 09/12/2022.    Past Medical History:  Diagnosis Date   Arthritis    Closed fracture dislocation of right elbow    GERD (gastroesophageal reflux disease)    Hypertension    Osteopenia    Rectal prolapse    Rheumatoid arthritis ([facility])    Scoliosis    Shingles    UTI (urinary tract infection)     Past Surgical History:  Procedure Laterality Date   CESAREAN SECTION     COLONOSCOPY     ESOPHAGOGASTRODUODENOSCOPY     ORIF ELBOW FRACTURE Right 07/01/2018   Procedure: OPEN REDUCTION INTERNAL FIXATION (ORIF) RIGHT ELBOW/OLECRANON FRACTURE;  Surgeon: Frederico Hamman, MD;  Location: [facility];  Service: Orthopedics;  Laterality: Right;    Family History  Problem Relation Age of Onset   Hypertension Mother    Parkinson's disease Mother    Cancer Father        prostate, chronic lymph leukemia   Heart failure Father    Hypertension Father    Hypertension Sister    Cancer Brother        CLL   Kidney Stones  Brother    Thyroid disease Maternal Grandfather    Breast cancer Maternal Aunt    Breast cancer Maternal Aunt    Breast cancer Maternal Aunt    Breast cancer Cousin    Colon cancer Other        1st cousin   Stomach cancer Neg Hx    Esophageal cancer Neg Hx     Social History   Socioeconomic History   Marital status: Married    Spouse name: Not on file   Number of children: 2   Years of education: Not on file   Highest education level: Not on file  Occupational History   Occupation: retired  Tobacco Use   Smoking status: Never   Smokeless tobacco: Never  Vaping Use   Vaping status: Never Used  Substance and Sexual Activity   Alcohol use: Not Currently   Drug use: No   Sexual activity: Not Currently     Partners: Male    Birth control/protection: Post-menopausal    Comment: First sexual encounter at 72 yrs old. Few than 5 partners.  Other Topics Concern   Not on file  Social History Narrative   Not on file   Social Determinants of Health   Financial Resource Strain: Not on file  Food Insecurity: Not on file  Transportation Needs: Not on file  Physical Activity: Not on file  Stress: Not on file  Social Connections: Not on file  Intimate Partner Violence: Not on file    ROS: see HPI      Objective    BP 134/82   Pulse 64   Temp 98.6 F (37 C) (Temporal)   Ht 4' 10.5" (1.486 m)   Wt 122 lb (55.3 kg)   SpO2 99%   BMI 25.06 kg/m      09/12/2022    1:45 PM  PHQ9 SCORE ONLY  PHQ-9 Total Score 7     Physical Exam Vitals reviewed.  Constitutional:      General: She is not in acute distress.    Appearance: Normal appearance.  HENT:     Head: Normocephalic and atraumatic.  Cardiovascular:     Rate and Rhythm: Normal rate and regular rhythm.     Pulses: Normal pulses.     Heart sounds: Normal heart sounds.  Pulmonary:     Effort: Pulmonary effort is normal.     Breath sounds: Normal breath sounds.  Skin:    General: Skin is warm and dry.  Neurological:     General: No focal deficit present.     Mental Status: She is alert and oriented to person, place, and time.  Psychiatric:        Mood and Affect: Mood normal.        Behavior: Behavior normal.        Judgment: Judgment normal.         Assessment & Plan:   Problem List Items Addressed This Visit       Cardiovascular and Mediastinum   Hypertension with normal renal function    Chronic, stable Blood pressure reasonable on current regimen. Continue atenolol 25 mg daily, benazepril 20 mg daily      Relevant Medications   rosuvastatin (CRESTOR) 20 MG tablet     Other   Burnout of caregiver - Primary    Chronic Refer to care management to see if there are any additional social services that  can be utilized to assist with her caregiving responsibilities.  Relevant Orders   AMB Referral to Community Care Coordinaton ([facility] Patients)   **Will request previous records from prior PCP as well as her specialist Dr. Dierdre Forth.  Further recommendations may ne made after review of records Return in about 3 months (around 12/13/2022) for F/U with Davionna Blacksher.   Elenore Paddy, NP

## 2022-09-15 ENCOUNTER — Telehealth: Payer: Self-pay | Admitting: *Deleted

## 2022-09-15 NOTE — Progress Notes (Signed)
  Care Coordination   Note   09/15/2022 Name: Sylvia Reynolds MRN: [id number] DOB: [birth year]  Ruffin Frederick Balik is a 72 y.o. year old female who sees Elenore Paddy, NP for primary care. I reached out to [name] by phone today to offer care coordination services.  Ms. Schraeder was given information about Care Coordination services today including:   The Care Coordination services include support from the care team which includes your Nurse Coordinator, Clinical Social Worker, or Pharmacist.  The Care Coordination team is here to help remove barriers to the health concerns and goals most important to you. Care Coordination services are voluntary, and the patient may decline or stop services at any time by request to their care team member.   Care Coordination Consent Status: Patient agreed to services and verbal consent obtained.   Follow up plan:  Telephone appointment with care coordination team member scheduled for:  09/25/2022  Encounter Outcome:  Pt. Scheduled from referral   Burman Nieves, [facility] Care Coordination Care Guide Direct Dial: [phone]

## 2022-09-25 ENCOUNTER — Ambulatory Visit: Payer: Self-pay | Admitting: Licensed Clinical Social Worker

## 2022-09-25 DIAGNOSIS — F4323 Adjustment disorder with mixed anxiety and depressed mood: Secondary | ICD-10-CM | POA: Diagnosis not present

## 2022-09-25 NOTE — Patient Outreach (Signed)
  Care Coordination  Initial Visit Note   09/25/2022 Name: Sylvia Reynolds MRN: [id number] DOB: 18-Jan-1951  Sylvia Reynolds is a 72 y.o. year old female who sees Elenore Paddy, NP for primary care. I spoke with  Sylvia Reynolds by phone today.  What matters to the patients health and wellness today?  Managing caregiver stress  Patient currently has all available community resources and support in place to assist with managing her stress and meeting her daughters' needs.   Goals Addressed             This Visit's Progress    COMPLETED: Manage caregiver stress       Activities and task to complete in order to accomplish goals.   Keep all upcoming appointment discussed today Continue with compliance of taking medication prescribed by Doctor Self Support options  (Please continue meeting with your therapist bi-weekly) Continue working with your daughter's  care manager and support team with [facility] . She currently has all available resources in place with the innovation waiver to meet her needs.   Please continue to communicate your concerns to [facility] and the agency currently providing person care support  Continue to explore Alternative Family Living as an option for your daughter. [employer] will guide you with this.       SDOH assessments and interventions completed:  Yes  SDOH Interventions Today    Flowsheet Row Most Recent Value  SDOH Interventions   Food Insecurity Interventions Intervention Not Indicated  Housing Interventions Intervention Not Indicated  Transportation Interventions Intervention Not Indicated  Utilities Interventions Intervention Not Indicated  Financial Strain Interventions Intervention Not Indicated  Stress Interventions Intervention Not Indicated  Health Literacy Interventions Intervention Not Indicated       Care Coordination Interventions:  Yes, provided  Interventions Today    Flowsheet Row Most Recent Value  Chronic Disease   Chronic disease during  today's visit Hypertension (HTN)  General Interventions   General Interventions Discussed/Reviewed General Interventions Reviewed  Education Interventions   Education Provided Provided Education  Provided Verbal Education On [employer] and discussed options]  Mental Health Interventions   Mental Health Discussed/Reviewed Mental Health Discussed  [current connected with a therapist for about 1 year and seeing her biweekly ( active listening, solution focused)]       Follow up plan: No further intervention required. Patient will contact LCSW if needed  Encounter Outcome:  Pt. Visit Completed   Sammuel Hines, LCSW Social Work Care Coordination  [facility] [employer] [phone]

## 2022-09-25 NOTE — Patient Instructions (Signed)
Social Work Visit Information  Thank you for taking time to visit with me today. Please don't hesitate to contact me if I can be of assistance to you.   Following are the goals we discussed today:   Goals Addressed             This Visit's Progress    COMPLETED: Manage caregiver stress       Activities and task to complete in order to accomplish goals.   Keep all upcoming appointment discussed today Continue with compliance of taking medication prescribed by [facility] Support options  (Please continue meeting with your therapist bi-weekly) Continue working with your daughter's  care manager and support team with [facility] . She currently has all available resources in place with the innovation waiver to meet her needs.   Please continue to communicate your concerns to [facility] and the agency currently providing person care support  Continue to explore Alternative Family Living as an option for your daughter. [employer] will guide you with this.        Patient  does not require continued follow-up by social work. They will contact the office if needed or call LCSW  Please call the care guide team at [phone] if you need to cancel or reschedule your appointment.   If you or anyone you know are experiencing a Mental Health or Behavioral Health Crisis or need someone to talk to, please call the Suicide and Crisis Lifeline: 988 call the [place] National Suicide Prevention Lifeline: 434-067-6574 or TTY: 302-615-3788 TTY 661 874 0794) to talk to a trained counselor call 1-800-273-TALK (toll free, 24 hour hotline) go to [facility] [address] [place] [phone])   Patient verbalizes understanding of instructions and care plan provided today and agrees to view in MyChart. Active MyChart status and patient understanding of how to access instructions and care plan via MyChart confirmed with patient.       Sylvia Hines, LCSW Social Work  Care Coordination  [facility] [employer] [employer] [phone]

## 2022-10-14 DIAGNOSIS — F4323 Adjustment disorder with mixed anxiety and depressed mood: Secondary | ICD-10-CM | POA: Diagnosis not present

## 2022-10-15 ENCOUNTER — Ambulatory Visit: Payer: Medicare PPO | Admitting: Obstetrics & Gynecology

## 2022-10-15 DIAGNOSIS — Z6824 Body mass index (BMI) 24.0-24.9, adult: Secondary | ICD-10-CM | POA: Diagnosis not present

## 2022-10-15 DIAGNOSIS — M25551 Pain in right hip: Secondary | ICD-10-CM | POA: Diagnosis not present

## 2022-10-15 DIAGNOSIS — M79671 Pain in right foot: Secondary | ICD-10-CM | POA: Diagnosis not present

## 2022-10-15 DIAGNOSIS — M0589 Other rheumatoid arthritis with rheumatoid factor of multiple sites: Secondary | ICD-10-CM | POA: Diagnosis not present

## 2022-10-15 DIAGNOSIS — M1991 Primary osteoarthritis, unspecified site: Secondary | ICD-10-CM | POA: Diagnosis not present

## 2022-10-15 DIAGNOSIS — M8580 Other specified disorders of bone density and structure, unspecified site: Secondary | ICD-10-CM | POA: Diagnosis not present

## 2022-10-23 DIAGNOSIS — F4323 Adjustment disorder with mixed anxiety and depressed mood: Secondary | ICD-10-CM | POA: Diagnosis not present

## 2022-10-28 ENCOUNTER — Ambulatory Visit (HOSPITAL_COMMUNITY)
Admission: EM | Admit: 2022-10-28 | Discharge: 2022-10-28 | Disposition: A | Discharge status: Home / Self Care | Payer: Medicare PPO | Attending: Physician Assistant | Admitting: Physician Assistant

## 2022-10-28 ENCOUNTER — Ambulatory Visit (HOSPITAL_COMMUNITY): Payer: Medicare PPO

## 2022-10-28 ENCOUNTER — Encounter (HOSPITAL_COMMUNITY): Payer: Self-pay

## 2022-10-28 DIAGNOSIS — Z4789 Encounter for other orthopedic aftercare: Secondary | ICD-10-CM | POA: Diagnosis not present

## 2022-10-28 DIAGNOSIS — M79661 Pain in right lower leg: Secondary | ICD-10-CM | POA: Diagnosis not present

## 2022-10-28 DIAGNOSIS — M79604 Pain in right leg: Secondary | ICD-10-CM | POA: Diagnosis not present

## 2022-10-28 DIAGNOSIS — W19XXXA Unspecified fall, initial encounter: Secondary | ICD-10-CM | POA: Diagnosis not present

## 2022-10-28 DIAGNOSIS — M7989 Other specified soft tissue disorders: Secondary | ICD-10-CM | POA: Diagnosis not present

## 2022-10-28 DIAGNOSIS — M25421 Effusion, right elbow: Secondary | ICD-10-CM

## 2022-10-28 DIAGNOSIS — M25521 Pain in right elbow: Secondary | ICD-10-CM | POA: Diagnosis not present

## 2022-10-28 DIAGNOSIS — M1711 Unilateral primary osteoarthritis, right knee: Secondary | ICD-10-CM | POA: Diagnosis not present

## 2022-10-28 NOTE — Discharge Instructions (Signed)
Your xrays show no broken bones Recommend ice, elevation, rest.  Keep abrasions clean, can apply triple antibiotic ointment Follow up with primary care physician if needed

## 2022-10-28 NOTE — ED Triage Notes (Signed)
Pt presents with c/o pain to the rt shin and presents with c/o scraped around her arms.   States she was walking and tripped when walking.

## 2022-10-30 ENCOUNTER — Ambulatory Visit: Payer: Medicare PPO | Admitting: Obstetrics and Gynecology

## 2022-11-04 ENCOUNTER — Ambulatory Visit (INDEPENDENT_AMBULATORY_CARE_PROVIDER_SITE_OTHER): Payer: Medicare PPO

## 2022-11-04 VITALS — Ht 58.5 in | Wt 122.0 lb

## 2022-11-04 DIAGNOSIS — Z780 Asymptomatic menopausal state: Secondary | ICD-10-CM | POA: Diagnosis not present

## 2022-11-04 DIAGNOSIS — Z0000 Encounter for general adult medical examination without abnormal findings: Secondary | ICD-10-CM | POA: Diagnosis not present

## 2022-11-04 NOTE — Patient Instructions (Addendum)
Sylvia Reynolds , Thank you for taking time to come for your Medicare Wellness Visit. I appreciate your ongoing commitment to your health goals. Please review the following plan we discussed and let me know if I can assist you in the future.   Referrals/Orders/Follow-Ups/Clinician Recommendations: Remember to call [place] at [facility] to get scheduled for your Bone Density screening.  You are also due for for the Shingles and Pneumonia vaccines as well.  You can have these done at your local pharmacy.  Please call to get scheduled for your yearly mammogram as well.  Each day, aim for 6 glasses of water, plenty of protein in your diet and try to get up and walk/ stretch every hour for 5-10 minutes at a time.    This is a list of the screening recommended for you and due dates:  Health Maintenance  Topic Date Due   Hepatitis C Screening  Never done   Zoster (Shingles) Vaccine (1 of 2) Never done   Pneumonia Vaccine (1 of 1 - PCV) Never done   DEXA scan (bone density measurement)  Never done   Flu Shot  10/02/2022   COVID-19 Vaccine (5 - 2023-24 season) 11/02/2022   Medicare Annual Wellness Visit  11/04/2023   Mammogram  12/07/2023   Colon Cancer Screening  09/20/2024   DTaP/Tdap/Td vaccine (2 - Td or Tdap) 04/15/2032   HPV Vaccine  Aged Out    Advanced directives: (Copy Requested) Please bring a copy of your health care power of attorney and living will to the office to be added to your chart at your convenience.  Next Medicare Annual Wellness Visit scheduled for next year: Yes

## 2022-11-04 NOTE — Progress Notes (Cosign Needed Addendum)
Subjective:   Sylvia Reynolds is a 72 y.o. female who presents for an Initial Medicare Annual Wellness Visit.  Visit Complete: Virtual  I connected with  Sylvia Reynolds on 11/04/22 by a audio enabled telemedicine application and verified that I am speaking with the correct person using two identifiers.  Patient Location: Home  Provider Location: Home Office  I discussed the limitations of evaluation and management by telemedicine. The patient expressed understanding and agreed to proceed.  Vital Signs: Because this visit was a virtual/telehealth visit, some criteria may be missing or patient reported. Any vitals not documented were not able to be obtained and vitals that have been documented are patient reported.    Review of Systems    0 Cardiac Risk Factors include: advanced age (>22men, >84 women);hypertension;Other (see comment), Risk factor comments: Rectal prolapse     Objective:    Today's Vitals   11/04/22 1429 11/04/22 1431  Weight: 122 lb (55.3 kg)   Height: 4' 10.5" (1.486 m)   PainSc:  4    Body mass index is 25.06 kg/m.     11/04/2022    2:43 PM 07/09/2021   10:26 AM 06/02/2021    5:24 PM 07/01/2018    6:33 AM 06/30/2018    9:45 AM 02/04/2016    3:34 PM  Advanced Directives  Does Patient Have a Medical Advance Directive? Yes Yes Yes No No No  Type of Estate agent of [place];Living will  Healthcare Power of [place];Living will     Does patient want to make changes to medical advance directive?  No - Patient declined      Copy of Healthcare Power of Attorney in Chart? No - copy requested   No - copy requested No - copy requested   Would patient like information on creating a medical advance directive?   No - Patient declined No - Patient declined No - Patient declined No - Patient declined    Current Medications (verified) Outpatient Encounter Medications as of 11/04/2022  Medication Sig   Ascorbic Acid (VITAMIN C PO) Take by mouth.   atenolol  (TENORMIN) 25 MG tablet Take 25 mg by mouth daily.   benazepril (LOTENSIN) 20 MG tablet Take 20 mg by mouth daily.   calcium citrate-vitamin D (CITRACAL+D) 315-200 MG-UNIT tablet Take 2 tablets by mouth daily.   cholecalciferol (VITAMIN D3) 25 MCG (1000 UNIT) tablet    folic acid (FOLVITE) 1 MG tablet Take 1 mg by mouth daily.   loratadine (CLARITIN) 10 MG tablet Take 10 mg by mouth daily.   Lysine 1000 MG TABS Take 1 tablet by mouth daily.   methotrexate 2.5 MG tablet Take 15 mg by mouth once a week.   Multiple Vitamins-Minerals (MULTI COMPLETE/IRON PO) Take 1 tablet by mouth 2 (two) times daily.   Omega-3 Fatty Acids (FISH OIL) 1200 MG CAPS Take 2 capsules by mouth daily.   pantoprazole (PROTONIX) 20 MG tablet Take 1 tablet in the morning and 1 tablet in the evening as needed.   polyethylene glycol powder (GLYCOLAX/MIRALAX) 17 GM/SCOOP powder    rosuvastatin (CRESTOR) 20 MG tablet Take 20 mg by mouth daily.   No facility-administered encounter medications on file as of 11/04/2022.    Allergies (verified) Zithromax [azithromycin], Ceftin [cefuroxime axetil], and Tape   History: Past Medical History:  Diagnosis Date   Arthritis    Closed fracture dislocation of right elbow    GERD (gastroesophageal reflux disease)    Hypertension  Osteopenia    Rectal prolapse    Rheumatoid arthritis (HCC)    Scoliosis    Shingles    UTI (urinary tract infection)    Past Surgical History:  Procedure Laterality Date   CESAREAN SECTION     COLONOSCOPY     ESOPHAGOGASTRODUODENOSCOPY     ORIF ELBOW FRACTURE Right 07/01/2018   Procedure: OPEN REDUCTION INTERNAL FIXATION (ORIF) RIGHT ELBOW/OLECRANON FRACTURE;  Surgeon: Frederico Hamman, MD;  Location: [facility];  Service: Orthopedics;  Laterality: Right;   Family History  Problem Relation Age of Onset   Hypertension Mother    Parkinson's disease Mother    Cancer Father        prostate, chronic lymph leukemia   Heart failure  Father    Hypertension Father    Hypertension Sister    Cancer Brother        CLL   Kidney Stones Brother    Thyroid disease Maternal Grandfather    Breast cancer Maternal Aunt    Breast cancer Maternal Aunt    Breast cancer Maternal Aunt    Breast cancer Cousin    Colon cancer Other        1st cousin   Stomach cancer Neg Hx    Esophageal cancer Neg Hx    Social History   Socioeconomic History   Marital status: Married    Spouse name: Onalee Hua   Number of children: 2   Years of education: Not on file   Highest education level: Not on file  Occupational History   Occupation: retired  Tobacco Use   Smoking status: Never   Smokeless tobacco: Never  Vaping Use   Vaping status: Never Used  Substance and Sexual Activity   Alcohol use: Not Currently   Drug use: No   Sexual activity: Not Currently    Partners: Male    Birth control/protection: Post-menopausal    Comment: First sexual encounter at 72 yrs old. Few than 5 partners.  Other Topics Concern   Not on file  Social History Narrative   Adult daughter lives there, she is physically handicapped   Social Determinants of Health   Financial Resource Strain: Low Risk  (11/04/2022)   Overall Financial Resource Strain (CARDIA)    Difficulty of Paying Living Expenses: Not hard at all  Food Insecurity: No Food Insecurity (11/04/2022)   Hunger Vital Sign    Worried About Running Out of Food in the Last Year: Never true    Ran Out of Food in the Last Year: Never true  Transportation Needs: No Transportation Needs (11/04/2022)   PRAPARE - Administrator, Civil Service (Medical): No    Lack of Transportation (Non-Medical): No  Physical Activity: Insufficiently Active (11/04/2022)   Exercise Vital Sign    Days of Exercise per Week: 4 days    Minutes of Exercise per Session: 20 min  Stress: Stress Concern Present (11/04/2022)   [employer] of Occupational Health - Occupational Stress Questionnaire    Feeling of Stress  : To some extent  Social Connections: Moderately Isolated (11/04/2022)   Social Connection and Isolation Panel [NHANES]    Frequency of Communication with Friends and Family: Three times a week    Frequency of Social Gatherings with Friends and Family: Once a week    Attends Religious Services: Never    Database administrator or Organizations: No    Attends Banker Meetings: Never    Marital Status: Married  Tobacco Counseling Counseling given: Not Answered   Clinical Intake:  Pre-visit preparation completed: Yes  Pain : 0-10 Pain Score: 4  (Had a fall) Pain Type: Chronic pain Pain Location: Knee (and chin) Pain Orientation: Right Pain Radiating Towards: Rt side Pain Descriptors / Indicators: Aching     BMI - recorded: 25.06 Nutritional Status: BMI 25 -29 Overweight Nutritional Risks: None Diabetes: No  How often do you need to have someone help you when you read instructions, pamphlets, or other written materials from your doctor or pharmacy?: 1 - Never  Interpreter Needed?: No  Information entered by :: Mahreen Schewe, RMA   Activities of Daily Living    11/04/2022    2:34 PM  In your present state of health, do you have any difficulty performing the following activities:  Hearing? 0  Vision? 0  Difficulty concentrating or making decisions? 0  Walking or climbing stairs? 0  Dressing or bathing? 0  Doing errands, shopping? 0  Preparing Food and eating ? N  Using the Toilet? N  In the past six months, have you accidently leaked urine? N  Do you have problems with loss of bowel control? N  Managing your Medications? N  Managing your Finances? N  Housekeeping or managing your Housekeeping? N    Patient Care Team: Elenore Paddy, NP as PCP - General (Nurse Practitioner)  Indicate any recent Medical Services you may have received from other than [facility] providers in the past year (date may be approximate).     Assessment:   This is a routine  wellness examination for [place].  Hearing/Vision screen Hearing Screening - Comments:: Denies hearing difficulties   Vision Screening - Comments:: Denies any vision issues.  Dietary issues and exercise activities discussed:     Goals Addressed               This Visit's Progress     Patient Stated (pt-stated)        Wanting to get back to exercising more. Has not been due to helping her daughter. Wants to watch her eating habits. Would like to go to bed earlier than she does.      Depression Screen    11/04/2022    2:51 PM 09/12/2022    1:45 PM  PHQ 2/9 Scores  PHQ - 2 Score 1 2  PHQ- 9 Score 3 7    Fall Risk    11/04/2022    2:44 PM 09/12/2022    1:25 PM  Fall Risk   Falls in the past year? 1 0  Number falls in past yr: 0 0  Injury with Fall? 1 0  Risk for fall due to :  No Fall Risks  Follow up Falls evaluation completed;Falls prevention discussed Falls evaluation completed    MEDICARE RISK AT HOME: Medicare Risk at Home Any stairs in or around the home?: Yes If so, are there any without handrails?: Yes Home free of loose throw rugs in walkways, pet beds, electrical cords, etc?: Yes Adequate lighting in your home to reduce risk of falls?: Yes Life alert?: No Use of a cane, walker or w/c?: No Grab bars in the bathroom?: No Shower chair or bench in shower?: No Elevated toilet seat or a handicapped toilet?: No  TIMED UP AND GO:  Was the test performed? No    Cognitive Function:        11/04/2022    2:46 PM  6CIT Screen  What Year? 0 points  What month?  0 points  What time? 0 points  Count back from 20 0 points  Months in reverse 0 points  Repeat phrase 0 points  Total Score 0 points    Immunizations Immunization History  Administered Date(s) Administered   Influenza Inj Mdck Quad Pf 12/30/2016   Moderna SARS-COV2 Booster Vaccination 06/28/2020   PFIZER(Purple Top)SARS-COV-2 Vaccination 04/09/2019, 05/03/2019, 12/27/2019   Tdap 04/15/2022     TDAP status: Up to date  Flu Vaccine status: Due, Education has been provided regarding the importance of this vaccine. Advised may receive this vaccine at local pharmacy or Health Dept. Aware to provide a copy of the vaccination record if obtained from local pharmacy or Health Dept. Verbalized acceptance and understanding.  Pneumococcal vaccine status: Due, Education has been provided regarding the importance of this vaccine. Advised may receive this vaccine at local pharmacy or Health Dept. Aware to provide a copy of the vaccination record if obtained from local pharmacy or Health Dept. Verbalized acceptance and understanding.  Covid-19 vaccine status: Information provided on how to obtain vaccines.   Qualifies for Shingles Vaccine? Yes   Zostavax completed Yes   Shingrix Completed?: No.    Education has been provided regarding the importance of this vaccine. Patient has been advised to call insurance company to determine out of pocket expense if they have not yet received this vaccine. Advised may also receive vaccine at local pharmacy or Health Dept. Verbalized acceptance and understanding.  Screening Tests Health Maintenance  Topic Date Due   Hepatitis C Screening  Never done   Zoster Vaccines- Shingrix (1 of 2) Never done   Pneumonia Vaccine 77+ Years old (1 of 1 - PCV) Never done   DEXA SCAN  Never done   INFLUENZA VACCINE  10/02/2022   COVID-19 Vaccine (5 - 2023-24 season) 11/02/2022   Medicare Annual Wellness (AWV)  11/04/2023   MAMMOGRAM  12/07/2023   Colonoscopy  09/20/2024   DTaP/Tdap/Td (2 - Td or Tdap) 04/15/2032   HPV VACCINES  Aged Out    Health Maintenance  Health Maintenance Due  Topic Date Due   Hepatitis C Screening  Never done   Zoster Vaccines- Shingrix (1 of 2) Never done   Pneumonia Vaccine 66+ Years old (1 of 1 - PCV) Never done   DEXA SCAN  Never done   INFLUENZA VACCINE  10/02/2022   COVID-19 Vaccine (5 - 2023-24 season) 11/02/2022     Colorectal cancer screening: Type of screening: Colonoscopy. Completed 09/20/2021. Repeat every 3 years  Mammogram status: Completed 12/06/2021. Repeat every year  Bone Density status: Ordered 11/04/2022. Pt provided with contact info and advised to call to schedule appt.  Lung Cancer Screening: (Low Dose CT Chest recommended if Age 34-80 years, 20 pack-year currently smoking OR have quit w/in 15years.) does not qualify.   Lung Cancer Screening Referral: N/A  Additional Screening:  Hepatitis C Screening: does qualify;   Vision Screening: Recommended annual ophthalmology exams for early detection of glaucoma and other disorders of the eye. Is the patient up to date with their annual eye exam?  Yes  Who is the provider or what is the name of the office in which the patient attends annual eye exams? Dr. Randon Goldsmith If pt is not established with a provider, would they like to be referred to a provider to establish care? No .   Dental Screening: Recommended annual dental exams for proper oral hygiene    Community Resource Referral / Chronic Care Management: CRR required this visit?  No   CCM required this visit?  No     Plan:     I have personally reviewed and noted the following in the patient's chart:   Medical and social history Use of alcohol, tobacco or illicit drugs  Current medications and supplements including opioid prescriptions. Patient is not currently taking opioid prescriptions. Functional ability and status Nutritional status Physical activity Advanced directives List of other physicians Hospitalizations, surgeries, and ER visits in previous 12 months Vitals Screenings to include cognitive, depression, and falls Referrals and appointments  In addition, I have reviewed and discussed with patient certain preventive protocols, quality metrics, and best practice recommendations. A written personalized care plan for preventive services as well as general preventive  health recommendations were provided to patient.     Jamillah Camilo L Marcus Schwandt, CMA   11/04/2022   After Visit Summary: (MyChart) Due to this being a telephonic visit, the after visit summary with patients personalized plan was offered to patient via MyChart   Nurse Notes: Patient is due for am DEXA, which referral has been placed today.  Patient is also due for a Hep C screening.  She is due for a Shingrix and Pneumonia vaccine as well.  Patient had no other concerns to address today.  She will be calling to schedule her mammogram for this year soon.

## 2022-11-06 DIAGNOSIS — F4323 Adjustment disorder with mixed anxiety and depressed mood: Secondary | ICD-10-CM | POA: Diagnosis not present

## 2022-11-14 ENCOUNTER — Telehealth: Payer: Self-pay | Admitting: Nurse Practitioner

## 2022-11-14 NOTE — Telephone Encounter (Signed)
Previous medical records received. Most recent lab results are as follows:  07/23/22: CMP: Glucose 86 BUN 20 Creatinine 0.78 eGFR 82 BUN/creatinine ratio 26 Sodium 144 Potassium 4.6 Chloride 105 Carbon dioxide 25 Calcium 9.9 Protein, total 6.2 Albumin 4.3 Globulin, total 1.9 A/G Ratio 2.3 Bilirubin, total 0.3 Alkaline phosphatase 53 AST 29 ALT 37  Lipid panel: Cholesterol, total 137 Triglycerides 73 HDL 59 VLDL 15 LDL63  12/17/2021 CBC WBC 3.3 RBC 4.03 Hgb 13.3 Hct 38.9 MCV 96.5 MCH 33.0 MCHC 34.2 RDW 43.6 Platelet count 199  TSH 2.61

## 2022-11-19 DIAGNOSIS — F4323 Adjustment disorder with mixed anxiety and depressed mood: Secondary | ICD-10-CM | POA: Diagnosis not present

## 2022-11-21 ENCOUNTER — Encounter: Payer: Self-pay | Admitting: Nurse Practitioner

## 2022-11-21 ENCOUNTER — Other Ambulatory Visit: Payer: Self-pay | Admitting: Nurse Practitioner

## 2022-11-21 DIAGNOSIS — R051 Acute cough: Secondary | ICD-10-CM

## 2022-11-21 MED ORDER — PSEUDOEPH-BROMPHEN-DM 30-2-10 MG/5ML PO SYRP: 5.0000 mL | ORAL_SOLUTION | Freq: Four times a day (QID) | ORAL | 0 refills | Status: DC | PRN

## 2022-11-27 ENCOUNTER — Other Ambulatory Visit: Payer: Self-pay | Admitting: Obstetrics and Gynecology

## 2022-11-27 DIAGNOSIS — Z1231 Encounter for screening mammogram for malignant neoplasm of breast: Secondary | ICD-10-CM

## 2022-11-28 ENCOUNTER — Ambulatory Visit (INDEPENDENT_AMBULATORY_CARE_PROVIDER_SITE_OTHER): Payer: Medicare PPO

## 2022-11-28 DIAGNOSIS — Z23 Encounter for immunization: Secondary | ICD-10-CM

## 2022-11-28 NOTE — Progress Notes (Signed)
Patient presented in office today for her HD Flu Vaccine. HD Flu Vaccine was administered into her Left Deltoid Muscle. Patient tolerated injection well and the injection site look well. Patient advised to report to the office immediately if she noticed any adverse reactions.

## 2022-12-03 DIAGNOSIS — F4323 Adjustment disorder with mixed anxiety and depressed mood: Secondary | ICD-10-CM | POA: Diagnosis not present

## 2022-12-17 DIAGNOSIS — F4323 Adjustment disorder with mixed anxiety and depressed mood: Secondary | ICD-10-CM | POA: Diagnosis not present

## 2022-12-18 ENCOUNTER — Ambulatory Visit: Payer: Medicare PPO | Admitting: Nurse Practitioner

## 2022-12-19 ENCOUNTER — Ambulatory Visit: Payer: Medicare PPO | Admitting: Nurse Practitioner

## 2022-12-25 ENCOUNTER — Ambulatory Visit
Admission: RE | Admit: 2022-12-25 | Discharge: 2022-12-25 | Disposition: A | Discharge status: Home / Self Care | Payer: Medicare PPO | Source: Ambulatory Visit | Attending: Obstetrics and Gynecology | Admitting: Obstetrics and Gynecology

## 2022-12-25 DIAGNOSIS — Z1231 Encounter for screening mammogram for malignant neoplasm of breast: Secondary | ICD-10-CM

## 2022-12-30 DIAGNOSIS — F4323 Adjustment disorder with mixed anxiety and depressed mood: Secondary | ICD-10-CM | POA: Diagnosis not present

## 2023-01-07 ENCOUNTER — Ambulatory Visit: Payer: Medicare PPO | Admitting: Gastroenterology

## 2023-01-07 ENCOUNTER — Encounter: Payer: Self-pay | Admitting: Gastroenterology

## 2023-01-07 VITALS — BP 130/74 | HR 72 | Ht 58.5 in | Wt 122.5 lb

## 2023-01-07 DIAGNOSIS — M6289 Other specified disorders of muscle: Secondary | ICD-10-CM | POA: Diagnosis not present

## 2023-01-07 DIAGNOSIS — K219 Gastro-esophageal reflux disease without esophagitis: Secondary | ICD-10-CM | POA: Diagnosis not present

## 2023-01-07 NOTE — Progress Notes (Signed)
01/07/2023 Sylvia Reynolds [id number] 07-12-1950   HISTORY OF PRESENT ILLNESS: This is a pleasant 72 year old female who is a patient of Dr. Elana Alm.  She follows here for issues related to acid reflux.  Also had rectal prolapse and underwent pelvic floor physical therapy with feedback.  Takes pantoprazole 20 mg daily in the morning and then every other day takes another pantoprazole 20 mg in the evening.  In regards to constipation she takes either a half capful to a three quarters capful of MiraLAX daily.  Colonoscopy and EGD 08/2021:  - Decreased sphincter tone found on digital rectal exam. - Two 4 to 12 mm polyps in the transverse colon and in the ascending colon, removed with a cold snare. Resected and retrieved. - Non- bleeding external and internal hemorrhoids.  - LA Grade C erosive esophagitis with no bleeding. - Medium- sized hiatal hernia. - Gastritis. Biopsied. - Multiple gastric polyps. Biopsied. - Normal examined duodenum.  1. Surgical [P], gastric antrum and gastric body and fundic gland polyp biopsy FINDINGS CONSISTENT WITH FUNDIC GLAND POLYP. NO SIGNIFICANT INFLAMMATION, DYSPLASIA OR MALIGNANCY IS SEEN. 2. Surgical [P], colon, hepatic flexure, ascending, polyp (2) FINDINGS CONSISTENT WITH TUBULAR ADENOMA X 2. NO DEFINITE HIGH-GRADE DYSPLASIA OR MALIGNANCY IS SEEN. CLINICAL AND ENDOSCOPIC CORRELATION IS REQUIRED.  Past Medical History:  Diagnosis Date   Arthritis    Closed fracture dislocation of right elbow    GERD (gastroesophageal reflux disease)    Hypertension    Osteopenia    Rectal prolapse    Rheumatoid arthritis (HCC)    Scoliosis    Shingles    UTI (urinary tract infection)    Past Surgical History:  Procedure Laterality Date   CESAREAN SECTION     COLONOSCOPY     ESOPHAGOGASTRODUODENOSCOPY     ORIF ELBOW FRACTURE Right 07/01/2018   Procedure: OPEN REDUCTION INTERNAL FIXATION (ORIF) RIGHT ELBOW/OLECRANON FRACTURE;  Surgeon: Frederico Hamman, MD;   Location: [facility];  Service: Orthopedics;  Laterality: Right;    reports that she has never smoked. She has never used smokeless tobacco. She reports that she does not currently use alcohol. She reports that she does not use drugs. family history includes Breast cancer in her cousin, maternal aunt, maternal aunt, and maternal aunt; Cancer in her brother and father; Colon cancer in an other family member; Heart failure in her father; Hypertension in her father, mother, and sister; Kidney Stones in her brother; Parkinson's disease in her mother; Thyroid disease in her maternal grandfather. Allergies  Allergen Reactions   Zithromax [Azithromycin] Diarrhea   Ceftin [Cefuroxime Axetil] Nausea Only   Tape Rash      Outpatient Encounter Medications as of 01/07/2023  Medication Sig   Ascorbic Acid (VITAMIN C PO) Take by mouth.   atenolol (TENORMIN) 25 MG tablet Take 25 mg by mouth daily.   benazepril (LOTENSIN) 20 MG tablet Take 20 mg by mouth daily.   brompheniramine-pseudoephedrine-DM 30-2-10 MG/5ML syrup Take 5 mLs by mouth 4 (four) times daily as needed.   calcium citrate-vitamin D (CITRACAL+D) 315-200 MG-UNIT tablet Take 2 tablets by mouth daily.   cholecalciferol (VITAMIN D3) 25 MCG (1000 UNIT) tablet    folic acid (FOLVITE) 1 MG tablet Take 1 mg by mouth daily.   loratadine (CLARITIN) 10 MG tablet Take 10 mg by mouth daily.   Lysine 1000 MG TABS Take 1 tablet by mouth daily.   methotrexate 2.5 MG tablet Take 15 mg by mouth once a week.  Multiple Vitamins-Minerals (MULTI COMPLETE/IRON PO) Take 1 tablet by mouth 2 (two) times daily.   Omega-3 Fatty Acids (FISH OIL) 1200 MG CAPS Take 2 capsules by mouth daily.   pantoprazole (PROTONIX) 20 MG tablet Take 1 tablet in the morning and 1 tablet in the evening as needed.   polyethylene glycol powder (GLYCOLAX/MIRALAX) 17 GM/SCOOP powder Take 0.5 Containers by mouth daily.   rosuvastatin (CRESTOR) 20 MG tablet Take 20 mg by mouth  daily.   No facility-administered encounter medications on file as of 01/07/2023.    REVIEW OF SYSTEMS  : All other systems reviewed and negative except where noted in the History of Present Illness.   PHYSICAL EXAM: BP 130/74   Pulse 72   Ht 4' 10.5" (1.486 m)   Wt 122 lb 8 oz (55.6 kg)   BMI 25.17 kg/m  General: Well developed white female in no acute distress Head: Normocephalic and atraumatic Eyes:  Sclerae anicteric, conjunctiva pink. Ears: Normal auditory acuity Lungs: Clear throughout to auscultation; no W/R/R. Heart: Regular rate and rhythm; no M/R/G. Abdomen: Soft, non-distended.  BS present.  Non-tender. Musculoskeletal: Symmetrical with no gross deformities  Skin: No lesions on visible extremities Extremities: No edema  Neurological: Alert oriented x 4, grossly non-focal Psychological:  Alert and cooperative. Normal mood and affect  ASSESSMENT AND PLAN: *GERD with esophagitis: Well-controlled on pantoprazole 20 mg daily and then also takes 20 mg in the evening about every other day.  Will provide prescription refills when needed.  She can follow-up in about 12 to 18 months or sooner if needed. *History of adenomatous colon polyps: Due for colonoscopy recall in July 2026. *Pelvic floor dysfunction and dyssynergia defecation:  Completed pelvic floor physical therapy for biofeedback.  She completed that and then was doing exercises, etc. at home.  She says that she needs to get back into doing that regularly, but is doing well.  Using MiraLAX usually about a half a capful to three quarters of a capful daily.   CC:  Georgianne Fick, MD

## 2023-01-07 NOTE — Patient Instructions (Signed)
Follow up in 12-18 months or sooner if needed.  _______________________________________________________  If your blood pressure at your visit was 140/90 or greater, please contact your primary care physician to follow up on this.  _______________________________________________________  If you are age 70 or older, your body mass index should be between 23-30. Your Body mass index is 25.17 kg/m. If this is out of the aforementioned range listed, please consider follow up with your Primary Care Provider.  If you are age 25 or younger, your body mass index should be between 19-25. Your Body mass index is 25.17 kg/m. If this is out of the aformentioned range listed, please consider follow up with your Primary Care Provider.   ________________________________________________________  The [place] providers would like to encourage you to use [facility] to communicate with providers for non-urgent requests or questions.  Due to [facility] hold times on the telephone, sending your provider a message by [facility] may be a faster and more efficient way to get a response.  Please allow 48 business hours for a response.  Please remember that this is for non-urgent requests.  _______________________________________________________

## 2023-01-08 ENCOUNTER — Ambulatory Visit (INDEPENDENT_AMBULATORY_CARE_PROVIDER_SITE_OTHER): Payer: Medicare PPO | Admitting: Obstetrics and Gynecology

## 2023-01-08 ENCOUNTER — Encounter: Payer: Self-pay | Admitting: Obstetrics and Gynecology

## 2023-01-08 VITALS — BP 124/80 | HR 62 | Ht <= 58 in | Wt 121.0 lb

## 2023-01-08 DIAGNOSIS — M8580 Other specified disorders of bone density and structure, unspecified site: Secondary | ICD-10-CM

## 2023-01-08 DIAGNOSIS — E2839 Other primary ovarian failure: Secondary | ICD-10-CM

## 2023-01-08 DIAGNOSIS — Z01419 Encounter for gynecological examination (general) (routine) without abnormal findings: Secondary | ICD-10-CM

## 2023-01-08 MED ORDER — ROMOSOZUMAB-AQQG 105 MG/1.17ML ~~LOC~~ SOSY
210.0000 mg | PREFILLED_SYRINGE | Freq: Once | SUBCUTANEOUS | Status: AC
  Administered 2023-11-26: 210 mg via SUBCUTANEOUS

## 2023-01-08 MED ORDER — DENOSUMAB 60 MG/ML ~~LOC~~ SOSY: 60.0000 mg | PREFILLED_SYRINGE | Freq: Once | SUBCUTANEOUS | Status: DC

## 2023-01-08 NOTE — Progress Notes (Signed)
72 y.o. y.o. female here for annual exam. She denies any PM bleeding.   G1P1L2  Married. Twin pregnancy.  1 daughter with cerebral palsy living with them that is wheel chair bound and she is caring for.   RP:  Established patient presenting for annual gyn exam    HPI: Postmenopausal, well on no HRT.  No PMB.  No pelvic pain.  Abstinent as husband had Prostate Ca.  Pap Neg 08/2018.  No significant h/o abnormal Pap.  Breasts normal.  Mammo 11/2022 birads1 Neg.  Colono 08/2021, not able to complete. Osteopenia BD 10/2019,has gone between osteoporosis and penia.  No meds on ca vit D. H/o two falls one with an arm fracture recently.  Hypertension, rheumatoid arthritis on methotrexate.  Body mass index is 25.29 kg/m. [facility] labs with Fam MD.   Blood pressure 124/80, pulse 62, height 4\' 10"  (1.473 m), weight 121 lb (54.9 kg), SpO2 96%.  No results found for: "DIAGPAP", "HPVHIGH", "ADEQPAP"  GYN HISTORY: No results found for: "DIAGPAP", "HPVHIGH", "ADEQPAP"  OB History  Gravida Para Term Preterm AB Living  1 1   1   2   SAB IAB Ectopic Multiple Live Births        1 2    # Outcome Date GA Lbr Len/2nd Weight Sex Type Anes PTL Lv  1A Preterm 08/25/83 [redacted]w[redacted]d  4 lb 13 oz (2.183 kg) F Vag-Forceps  Y LIV  1B Preterm 08/25/83 [redacted]w[redacted]d  4 lb (1.814 kg) F CS-Unspec  Y LIV    Past Medical History:  Diagnosis Date   Arthritis    Closed fracture dislocation of right elbow    GERD (gastroesophageal reflux disease)    Hypertension    Osteopenia    Rectal prolapse    Rheumatoid arthritis (HCC)    Scoliosis    Shingles    UTI (urinary tract infection)     Past Surgical History:  Procedure Laterality Date   CESAREAN SECTION     COLONOSCOPY     ESOPHAGOGASTRODUODENOSCOPY     ORIF ELBOW FRACTURE Right 07/01/2018   Procedure: OPEN REDUCTION INTERNAL FIXATION (ORIF) RIGHT ELBOW/OLECRANON FRACTURE;  Surgeon: Frederico Hamman, MD;  Location: [facility];  Service: Orthopedics;   Laterality: Right;    Current Outpatient Medications on File Prior to Visit  Medication Sig Dispense Refill   Ascorbic Acid (VITAMIN C PO) Take by mouth.     atenolol (TENORMIN) 25 MG tablet Take 25 mg by mouth daily.     benazepril (LOTENSIN) 20 MG tablet Take 20 mg by mouth daily.     calcium citrate-vitamin D (CITRACAL+D) 315-200 MG-UNIT tablet Take 2 tablets by mouth daily.     cholecalciferol (VITAMIN D3) 25 MCG (1000 UNIT) tablet      diclofenac (VOLTAREN) 75 MG EC tablet Take 75 mg by mouth daily.     folic acid (FOLVITE) 1 MG tablet Take 1 mg by mouth daily.     loratadine (CLARITIN) 10 MG tablet Take 10 mg by mouth daily.     Lysine 1000 MG TABS Take 1 tablet by mouth daily.     methotrexate 2.5 MG tablet Take 15 mg by mouth once a week.     Multiple Vitamins-Minerals (MULTI COMPLETE/IRON PO) Take 1 tablet by mouth 2 (two) times daily.     Omega-3 Fatty Acids (FISH OIL) 1200 MG CAPS Take 2 capsules by mouth daily.     pantoprazole (PROTONIX) 20 MG tablet Take 1 tablet in the  morning and 1 tablet in the evening as needed. 60 tablet 6   polyethylene glycol powder (GLYCOLAX/MIRALAX) 17 GM/SCOOP powder Take 0.5 Containers by mouth daily.     rosuvastatin (CRESTOR) 20 MG tablet Take 20 mg by mouth daily.     No current facility-administered medications on file prior to visit.    Social History   Socioeconomic History   Marital status: Married    Spouse name: Onalee Hua   Number of children: 2   Years of education: Not on file   Highest education level: Not on file  Occupational History   Occupation: retired  Tobacco Use   Smoking status: Never   Smokeless tobacco: Never  Vaping Use   Vaping status: Never Used  Substance and Sexual Activity   Alcohol use: Not Currently   Drug use: No   Sexual activity: Not Currently    Partners: Male    Birth control/protection: Post-menopausal    Comment: First sexual encounter at 72 yrs old. Few than 5 partners.  Other Topics Concern    Not on file  Social History Narrative   Adult daughter lives there, she is physically handicapped   Social Determinants of Health   Financial Resource Strain: Low Risk  (11/04/2022)   Overall Financial Resource Strain (CARDIA)    Difficulty of Paying Living Expenses: Not hard at all  Food Insecurity: No Food Insecurity (11/04/2022)   Hunger Vital Sign    Worried About Running Out of Food in the Last Year: Never true    Ran Out of Food in the Last Year: Never true  Transportation Needs: No Transportation Needs (11/04/2022)   PRAPARE - Administrator, Civil Service (Medical): No    Lack of Transportation (Non-Medical): No  Physical Activity: Insufficiently Active (11/04/2022)   Exercise Vital Sign    Days of Exercise per Week: 4 days    Minutes of Exercise per Session: 20 min  Stress: Stress Concern Present (11/04/2022)   [employer] of Occupational Health - Occupational Stress Questionnaire    Feeling of Stress : To some extent  Social Connections: Moderately Isolated (11/04/2022)   Social Connection and Isolation Panel [NHANES]    Frequency of Communication with Friends and Family: Three times a week    Frequency of Social Gatherings with Friends and Family: Once a week    Attends Religious Services: Never    Database administrator or Organizations: No    Attends Banker Meetings: Never    Marital Status: Married  Catering manager Violence: Not At Risk (11/04/2022)   Humiliation, Afraid, Rape, and Kick questionnaire    Fear of Current or Ex-Partner: No    Emotionally Abused: No    Physically Abused: No    Sexually Abused: No    Family History  Problem Relation Age of Onset   Hypertension Mother    Parkinson's disease Mother    Cancer Father        prostate, chronic lymph leukemia   Heart failure Father    Hypertension Father    Hypertension Sister    Cancer Brother        CLL   Kidney Stones Brother    Thyroid disease Maternal Grandfather    Breast  cancer Maternal Aunt    Breast cancer Maternal Aunt    Breast cancer Maternal Aunt    Breast cancer Cousin    Colon cancer Other        1st cousin   Stomach cancer Neg  Hx    Esophageal cancer Neg Hx      Allergies  Allergen Reactions   Zithromax [Azithromycin] Diarrhea   Ceftin [Cefuroxime Axetil] Nausea Only   Tape Rash      Patient's last menstrual period was No LMP recorded. Patient is postmenopausal..           Review of Systems Alls systems reviewed and are negative.     OBGyn Exam    A:         Well Woman GYN exam                             P:        Pap smear not indicated Encouraged annual mammogram screening Colon cancer screening up-to-date DXA ordered today  Counseled on options for treatment and information given on prolia and evenity.  R/b/a/I of each were discussed.  To run a PA on both Labs and immunizations ordered today Discussed breast self exams Encouraged healthy lifestyle practices Encouraged Vit D and Calcium   No follow-ups on file.  Earley Favor

## 2023-01-09 ENCOUNTER — Other Ambulatory Visit: Payer: Self-pay | Admitting: Gastroenterology

## 2023-01-09 DIAGNOSIS — K219 Gastro-esophageal reflux disease without esophagitis: Secondary | ICD-10-CM

## 2023-01-13 ENCOUNTER — Telehealth: Payer: Self-pay | Admitting: *Deleted

## 2023-01-13 DIAGNOSIS — M810 Age-related osteoporosis without current pathological fracture: Secondary | ICD-10-CM

## 2023-01-13 NOTE — Telephone Encounter (Signed)
-----   Message from Earley Favor sent at 01/08/2023 11:10 AM EST ----- Please run evenity and prolia benefits.  Has osteoporosis and goes back to penia. Has had arm fracture and multiple falls Loss in hight>1.5cm in a year  Needs medication Dr. Leonard Schwartz

## 2023-01-13 NOTE — Telephone Encounter (Signed)
 Insurance information submitted to [employer] portal. Will await summary of benefits for Evenity.

## 2023-01-14 DIAGNOSIS — F4323 Adjustment disorder with mixed anxiety and depressed mood: Secondary | ICD-10-CM | POA: Diagnosis not present

## 2023-01-22 ENCOUNTER — Encounter: Payer: Self-pay | Admitting: Nurse Practitioner

## 2023-01-23 DIAGNOSIS — M8580 Other specified disorders of bone density and structure, unspecified site: Secondary | ICD-10-CM | POA: Insufficient documentation

## 2023-01-28 ENCOUNTER — Other Ambulatory Visit: Payer: Self-pay | Admitting: Nurse Practitioner

## 2023-01-28 DIAGNOSIS — F4323 Adjustment disorder with mixed anxiety and depressed mood: Secondary | ICD-10-CM | POA: Diagnosis not present

## 2023-01-28 DIAGNOSIS — Z13820 Encounter for screening for osteoporosis: Secondary | ICD-10-CM

## 2023-02-04 ENCOUNTER — Ambulatory Visit (INDEPENDENT_AMBULATORY_CARE_PROVIDER_SITE_OTHER)
Admission: RE | Admit: 2023-02-04 | Discharge: 2023-02-04 | Disposition: A | Discharge status: Home / Self Care | Payer: Medicare PPO | Source: Ambulatory Visit | Attending: Nurse Practitioner | Admitting: Nurse Practitioner

## 2023-02-04 DIAGNOSIS — Z13820 Encounter for screening for osteoporosis: Secondary | ICD-10-CM

## 2023-02-04 NOTE — Telephone Encounter (Signed)
PA for evenity submitted to [facility] via Covermymeds. Key: [id number]. Will await response.

## 2023-02-05 ENCOUNTER — Encounter: Payer: Self-pay | Admitting: *Deleted

## 2023-02-05 NOTE — Telephone Encounter (Signed)
PA for Evenity denied by [facility] Appeal letter filled out and taken to Dr. Bonney Roussel desk to review and sign. Will fax once signed.

## 2023-02-10 NOTE — Telephone Encounter (Signed)
Letter of Medical Necessity Appeal for Evenity faxed to [facility] and Appeal Department at [phone]. Will await response.

## 2023-02-11 DIAGNOSIS — F4323 Adjustment disorder with mixed anxiety and depressed mood: Secondary | ICD-10-CM | POA: Diagnosis not present

## 2023-02-17 NOTE — Telephone Encounter (Signed)
 Message left to return call to [facility] at [phone].   Deductible:  none  OOP MAX: none   Annual exam: 01-08-23 EB   Calcium:    9.5        Date: 04/17/23 GFR:           84      Date: 04/17/23 Vitamin D: 83.2      Date: 04/17/23  Upcoming dental procedures: No   Hx of Kidney Disease: No   Hx of heart attack or stroke in the last 12 months: No  Last Bone Density Scan: 02-04-23   Is Prior Authorization needed: yes, approved 02/04/23 to 03/02/24. Reference #: [id number].   Pt estimated Cost: $40/month

## 2023-02-17 NOTE — Telephone Encounter (Signed)
Appeal decision received from [facility].   PA for Evenity approved 02/04/23 to 03/02/24. Reference #: [id number].

## 2023-02-17 NOTE — Telephone Encounter (Signed)
Returned call to patient. Advised of benefits as seen below for Evenity. Patient states she would like to read and do some research on Evenity. Has some concerns about GI side effects for Prolia, so would like to read more about Evenity. RN advised patient could return call once she decided how she would like to proceed. Patient agreeable and appreciative of phone call.

## 2023-02-18 ENCOUNTER — Telehealth: Payer: Self-pay | Admitting: Nurse Practitioner

## 2023-02-18 ENCOUNTER — Ambulatory Visit: Payer: Medicare PPO | Admitting: Nurse Practitioner

## 2023-02-18 VITALS — BP 116/74 | HR 55 | Temp 97.9°F | Ht <= 58 in | Wt 121.0 lb

## 2023-02-18 DIAGNOSIS — K219 Gastro-esophageal reflux disease without esophagitis: Secondary | ICD-10-CM | POA: Diagnosis not present

## 2023-02-18 DIAGNOSIS — H6123 Impacted cerumen, bilateral: Secondary | ICD-10-CM

## 2023-02-18 DIAGNOSIS — I10 Essential (primary) hypertension: Secondary | ICD-10-CM

## 2023-02-18 DIAGNOSIS — H6120 Impacted cerumen, unspecified ear: Secondary | ICD-10-CM | POA: Insufficient documentation

## 2023-02-18 DIAGNOSIS — M810 Age-related osteoporosis without current pathological fracture: Secondary | ICD-10-CM | POA: Insufficient documentation

## 2023-02-18 NOTE — Telephone Encounter (Signed)
Patient provides last labs via Dr. Dierdre Forth:  10/15/2022 CBC WBC: 3.8 RBC: 3.80 Hgb: 12.8 Hct: 37.1 MCV: 98 (H) MCH: 33.7 (H) Plt: 200

## 2023-02-18 NOTE — Assessment & Plan Note (Signed)
Chronic Follow-up with OB/GYN regarding treatment options.

## 2023-02-18 NOTE — ED Provider Notes (Signed)
[facility]    CSN: [id number] Arrival date & time: 10/28/22  1547      History   Chief Complaint Chief Complaint  Patient presents with   Fall    HPI Sylvia Reynolds is a 72 y.o. female.   Pt presents with right shin pain, right elbow pain, and scrapes on bilateral arms after she reports she tripped while walking, landing on her right shin.  No other injuries.  She has tried nothing for the sx. Ambulating without difficulty.     Past Medical History:  Diagnosis Date   Arthritis    Closed fracture dislocation of right elbow    GERD (gastroesophageal reflux disease)    Hypertension    Osteopenia    Rectal prolapse    Rheumatoid arthritis (HCC)    Scoliosis    Shingles    UTI (urinary tract infection)     Patient Active Problem List   Diagnosis Date Noted   Osteopenia 01/23/2023   Pelvic floor dysfunction 01/07/2023   Scoliosis 09/12/2022   Burnout of caregiver 09/12/2022   Injury of right foot 04/15/2022   Abrasion of right ankle 04/15/2022   Need for tetanus booster 04/15/2022   Gastroesophageal reflux disease 07/11/2021   Rectal prolapse 07/11/2021   Screening for colon cancer 07/11/2021   RA (rheumatoid arthritis) (HCC) 08/11/2018   Hypertension with normal renal function 08/11/2018   Olecranon fracture 06/11/2018    Past Surgical History:  Procedure Laterality Date   CESAREAN SECTION     COLONOSCOPY     ESOPHAGOGASTRODUODENOSCOPY     ORIF ELBOW FRACTURE Right 07/01/2018   Procedure: OPEN REDUCTION INTERNAL FIXATION (ORIF) RIGHT ELBOW/OLECRANON FRACTURE;  Surgeon: Frederico Hamman, MD;  Location: [facility];  Service: Orthopedics;  Laterality: Right;    OB History     Gravida  1   Para  1   Term      Preterm  1   AB      Living  2      SAB      IAB      Ectopic      Multiple  1   Live Births  2            Home Medications    Prior to Admission medications   Medication Sig Start Date End Date  Taking? Authorizing Provider  Ascorbic Acid (VITAMIN C PO) Take by mouth.    [provider]  atenolol (TENORMIN) 25 MG tablet Take 25 mg by mouth daily.    [provider]  benazepril (LOTENSIN) 20 MG tablet Take 20 mg by mouth daily.    [provider]  calcium citrate-vitamin D (CITRACAL+D) 315-200 MG-UNIT tablet Take 2 tablets by mouth daily.    [provider]  cholecalciferol (VITAMIN D3) 25 MCG (1000 UNIT) tablet     [provider]  diclofenac (VOLTAREN) 75 MG EC tablet Take 75 mg by mouth daily.    [provider]  folic acid (FOLVITE) 1 MG tablet Take 1 mg by mouth daily.    [provider]  loratadine (CLARITIN) 10 MG tablet Take 10 mg by mouth daily.    [provider]  Lysine 1000 MG TABS Take 1 tablet by mouth daily.    [provider]  methotrexate 2.5 MG tablet Take 15 mg by mouth once a week.    [provider]  Multiple Vitamins-Minerals (MULTI COMPLETE/IRON PO) Take 1 tablet by mouth 2 (two)  times daily.    [provider]  Omega-3 Fatty Acids (FISH OIL) 1200 MG CAPS Take 2 capsules by mouth daily.    [provider]  pantoprazole (PROTONIX) 20 MG tablet TAKE 1 TABLET IN THE MORNING AND 1 TABLET IN THE EVENING AS NEEDED. 01/09/23   Napoleon Form, MD  polyethylene glycol powder (GLYCOLAX/MIRALAX) 17 GM/SCOOP powder Take 0.5 Containers by mouth daily. 06/06/21   [provider]  rosuvastatin (CRESTOR) 20 MG tablet Take 20 mg by mouth daily.    [provider]    Family History Family History  Problem Relation Age of Onset   Hypertension Mother    Parkinson's disease Mother    Cancer Father        prostate, chronic lymph leukemia   Heart failure Father    Hypertension Father    Hypertension Sister    Cancer Brother        CLL   Kidney Stones Brother    Thyroid disease Maternal Grandfather    Breast cancer Maternal Aunt    Breast cancer  Maternal Aunt    Breast cancer Maternal Aunt    Breast cancer Cousin    Colon cancer Other        1st cousin   Stomach cancer Neg Hx    Esophageal cancer Neg Hx     Social History Social History   Tobacco Use   Smoking status: Never   Smokeless tobacco: Never  Vaping Use   Vaping status: Never Used  Substance Use Topics   Alcohol use: Not Currently   Drug use: No     Allergies   Zithromax [azithromycin], Ceftin [cefuroxime axetil], and Tape   Review of Systems Review of Systems  Constitutional:  Negative for chills and fever.  HENT:  Negative for ear pain and sore throat.   Eyes:  Negative for pain and visual disturbance.  Respiratory:  Negative for cough and shortness of breath.   Cardiovascular:  Negative for chest pain and palpitations.  Gastrointestinal:  Negative for abdominal pain and vomiting.  Genitourinary:  Negative for dysuria and hematuria.  Musculoskeletal:  Positive for arthralgias. Negative for back pain.  Skin:  Positive for wound. Negative for color change and rash.  Neurological:  Negative for seizures and syncope.  All other systems reviewed and are negative.    Physical Exam Triage Vital Signs ED Triage Vitals [10/28/22 1636]  Encounter Vitals Group     BP (!) 157/78     Systolic BP Percentile      Diastolic BP Percentile      Pulse Rate (!) 57     Resp 17     Temp 98.2 F (36.8 C)     Temp Source Oral     SpO2 100 %     Weight      Height      Head Circumference      Peak Flow      Pain Score 5     Pain Loc      Pain Education      Exclude from Growth Chart    No data found.  Updated Vital Signs BP (!) 157/78 (BP Location: Right Arm)   Pulse (!) 57   Temp 98.2 F (36.8 C) (Oral)   Resp 17   SpO2 100%   Visual Acuity Right Eye Distance:   Left Eye Distance:   Bilateral Distance:    Right Eye Near:   Left Eye Near:  Bilateral Near:     Physical Exam Vitals and nursing note reviewed.  Constitutional:       General: She is not in acute distress.    Appearance: She is well-developed.  HENT:     Head: Normocephalic and atraumatic.  Eyes:     Conjunctiva/sclera: Conjunctivae normal.  Cardiovascular:     Rate and Rhythm: Normal rate and regular rhythm.     Heart sounds: No murmur heard. Pulmonary:     Effort: Pulmonary effort is normal. No respiratory distress.     Breath sounds: Normal breath sounds.  Abdominal:     Palpations: Abdomen is soft.     Tenderness: There is no abdominal tenderness.  Musculoskeletal:        General: No swelling.     Cervical back: Neck supple.     Comments: TTP over right tibia, no abrasion or bruising noted.  Normal knee exam, normal ankle exam.   TTP to right elbow, normal ROM, neurovascularly intact, normal strength.   Skin:    General: Skin is warm and dry.     Capillary Refill: Capillary refill takes less than 2 seconds.  Neurological:     Mental Status: She is alert.  Psychiatric:        Mood and Affect: Mood normal.      UC Treatments / Results  Labs (all labs ordered are listed, but only abnormal results are displayed) Labs Reviewed - No data to display  EKG   Radiology No results found.  Procedures Procedures (including critical care time)  Medications Ordered in UC Medications - No data to display  Initial Impression / Assessment and Plan / UC Course  I have reviewed the triage vital signs and the nursing notes.  Pertinent labs & imaging results that were available during my care of the patient were reviewed by me and considered in my medical decision making (see chart for details).     Imagine negative for fracture, superficial abrasions.  Supportive care discussed.  Return precautions discussed.  Final Clinical Impressions(s) / UC Diagnoses   Final diagnoses:  Pain in right shin  Elbow swelling, right  Fall, initial encounter     Discharge Instructions      Your xrays show no broken bones Recommend ice, elevation,  rest.  Keep abrasions clean, can apply triple antibiotic ointment Follow up with primary care physician if needed   ED Prescriptions   None    PDMP not reviewed this encounter.   Ward, Tylene Fantasia, PA-C 02/18/23 1009

## 2023-02-18 NOTE — Progress Notes (Signed)
Established Patient Office Visit  Subjective   Patient ID: Sylvia Reynolds, female    DOB: 19-Oct-1950  Age: 72 y.o. MRN: [id number]  Chief Complaint  Patient presents with   Osteoporosis    Patient has for follow-up today.  She recently underwent DEXA which identified osteoporosis (right femoral neck T-score: -2.1, left femoral neck T-score: -1.9, distal radius T-score: -3.5).  Her OB/GYN is working on prior authorization for either [employer] or Prolia.  FRAX score completed today which identified major osteoporotic risk score of 38% and hip fracture of 12%.  She is on vitamin D and calcium supplementation.  She reports that she performs weightbearing exercises when she is able to, she takes care of her daughter who is cerebral palsy which can be a barrier and being able to leave the home to participate in exercise routinely.  She does have a new aide that has been reliably coming to the house to take care of her daughter which she feels will help her to be able to leave on a regular basis to complete exercise.  She has been on Fosamax in the past but discontinued this due to concerns of potential severe side effects.  She denies having had been diagnosed with necrosis of the jaw in the past.  Does have GERD which she reports is controlled with use of pantoprazole.  Otherwise, patient reports she is feeling well.  She continues on atenolol and benazepril for hypertension and tolerates this well.  She has a history of cerumen impaction and feels that she does have wax buildup and would like this to be evaluated bilaterally.    Review of Systems  Respiratory:  Negative for shortness of breath.   Cardiovascular:  Negative for chest pain and palpitations.  Musculoskeletal:  Negative for falls.      Objective:     BP 116/74   Pulse (!) 55   Temp 97.9 F (36.6 C) (Temporal)   Ht 4\' 10"  (1.473 m)   Wt 121 lb (54.9 kg)   SpO2 95%   BMI 25.29 kg/m  BP Readings from Last 3 Encounters:   02/18/23 116/74  01/08/23 124/80  01/07/23 130/74   Wt Readings from Last 3 Encounters:  02/18/23 121 lb (54.9 kg)  01/08/23 121 lb (54.9 kg)  01/07/23 122 lb 8 oz (55.6 kg)      Physical Exam Vitals reviewed.  Constitutional:      General: She is not in acute distress.    Appearance: Normal appearance.  HENT:     Head: Normocephalic and atraumatic.     Right Ear: There is impacted cerumen.     Left Ear: There is impacted cerumen.  Neck:     Vascular: No carotid bruit.  Cardiovascular:     Rate and Rhythm: Normal rate and regular rhythm.     Pulses: Normal pulses.     Heart sounds: Normal heart sounds.  Pulmonary:     Effort: Pulmonary effort is normal.     Breath sounds: Normal breath sounds.  Skin:    General: Skin is warm and dry.  Neurological:     General: No focal deficit present.     Mental Status: She is alert and oriented to person, place, and time.  Psychiatric:        Mood and Affect: Mood normal.        Behavior: Behavior normal.        Judgment: Judgment normal.      No results found  for any visits on 02/18/23.    The ASCVD Risk score (Arnett DK, et al., 2019) failed to calculate for the following reasons:   Cannot find a previous HDL lab   Cannot find a previous total cholesterol lab    Assessment & Plan:   Problem List Items Addressed This Visit       Cardiovascular and Mediastinum   Hypertension with normal renal function   Chronic, at goal, stable Continue atenolol 25 mg daily and benazepril 20 mg daily.  Per review of telephone note in September 2024, patient's last metabolic panel collected at her previous PCP in May 2024.  It identified GFR of 82, potassium 4.6, and creatinine of 0.78.        Digestive   Gastroesophageal reflux disease   Chronic, controlled, stable Continue pantoprazole 20 mg twice daily.        Nervous and Auditory   Cerumen impaction   Gentle bilateral ear lavage performed, patient tolerated procedure  well.  Tympanic membrane's bilaterally reviewed after lavage, they appear within normal limits.        Musculoskeletal and Integument   Osteoporosis without current pathological fracture - Primary   Chronic Follow-up with OB/GYN regarding treatment options.      Relevant Orders   VITAMIN D 25 Hydroxy (Vit-D Deficiency, Fractures)   Basic metabolic panel    Return in about 6 months (around 08/19/2023) for F/U with Zoiee Wimmer.    Elenore Paddy, NP

## 2023-02-18 NOTE — Assessment & Plan Note (Signed)
Chronic, controlled, stable Continue pantoprazole 20 mg twice daily.

## 2023-02-18 NOTE — Assessment & Plan Note (Signed)
Gentle bilateral ear lavage performed, patient tolerated procedure well.  Tympanic membrane's bilaterally reviewed after lavage, they appear within normal limits.

## 2023-02-18 NOTE — Assessment & Plan Note (Signed)
Chronic, at goal, stable Continue atenolol 25 mg daily and benazepril 20 mg daily.  Per review of telephone note in September 2024, patient's last metabolic panel collected at her previous PCP in May 2024.  It identified GFR of 82, potassium 4.6, and creatinine of 0.78.

## 2023-03-10 ENCOUNTER — Other Ambulatory Visit: Payer: Self-pay | Admitting: Nurse Practitioner

## 2023-03-11 DIAGNOSIS — F4323 Adjustment disorder with mixed anxiety and depressed mood: Secondary | ICD-10-CM | POA: Diagnosis not present

## 2023-03-18 ENCOUNTER — Other Ambulatory Visit: Payer: Self-pay | Admitting: Nurse Practitioner

## 2023-03-25 DIAGNOSIS — F4323 Adjustment disorder with mixed anxiety and depressed mood: Secondary | ICD-10-CM | POA: Diagnosis not present

## 2023-04-09 DIAGNOSIS — F4323 Adjustment disorder with mixed anxiety and depressed mood: Secondary | ICD-10-CM | POA: Diagnosis not present

## 2023-04-15 ENCOUNTER — Telehealth: Payer: Self-pay | Admitting: Nurse Practitioner

## 2023-04-15 ENCOUNTER — Encounter: Payer: Self-pay | Admitting: Nurse Practitioner

## 2023-04-15 NOTE — Telephone Encounter (Signed)
Patient would like the imaging from her bone density exam to be sent to Dr. Alben Deeds at [facility]. She has an appointment with him on 04/17/23 and is hoping he can get the results prior to her appointment. Best callback is [phone].

## 2023-04-15 NOTE — Telephone Encounter (Deleted)
Copied from CRM [id number]. Topic: Clinical - Lab/Test Results >> Apr 15, 2023 12:46 PM Sylvia Reynolds wrote: Reason for CRM: Patient would like an actual copy of her bone density scan, if not she would like it faxed

## 2023-04-15 NOTE — Telephone Encounter (Signed)
Copied from CRM [id number]. Topic: Clinical - Lab/Test Results >> Apr 15, 2023 12:46 PM Sylvia Reynolds wrote: Reason for CRM: Patient would like an actual copy of her bone density scan, if not she would like it faxed

## 2023-04-17 DIAGNOSIS — E559 Vitamin D deficiency, unspecified: Secondary | ICD-10-CM | POA: Diagnosis not present

## 2023-04-17 DIAGNOSIS — Z6824 Body mass index (BMI) 24.0-24.9, adult: Secondary | ICD-10-CM | POA: Diagnosis not present

## 2023-04-17 DIAGNOSIS — M0589 Other rheumatoid arthritis with rheumatoid factor of multiple sites: Secondary | ICD-10-CM | POA: Diagnosis not present

## 2023-04-17 DIAGNOSIS — M8580 Other specified disorders of bone density and structure, unspecified site: Secondary | ICD-10-CM | POA: Diagnosis not present

## 2023-04-17 DIAGNOSIS — M1991 Primary osteoarthritis, unspecified site: Secondary | ICD-10-CM | POA: Diagnosis not present

## 2023-04-20 NOTE — Telephone Encounter (Signed)
error 

## 2023-04-20 NOTE — Telephone Encounter (Signed)
Patient left message stating she was ready to start Evenity, request return call.

## 2023-04-21 DIAGNOSIS — F4323 Adjustment disorder with mixed anxiety and depressed mood: Secondary | ICD-10-CM | POA: Diagnosis not present

## 2023-04-21 NOTE — Telephone Encounter (Signed)
Insurance information for 2025 submitted to [employer] portal. Will await summary of benefits for Evenity.

## 2023-04-22 ENCOUNTER — Encounter: Payer: Self-pay | Admitting: Obstetrics and Gynecology

## 2023-04-23 NOTE — Telephone Encounter (Signed)
Dr. Karma Greaser -please confirm D3. Previous message to patient with recommendations of 3000 international units of D3 daily.   Per patient she is currently at 2300 international units daily.

## 2023-04-28 MED ORDER — ROMOSOZUMAB-AQQG 105 MG/1.17ML ~~LOC~~ SOSY
210.0000 mg | PREFILLED_SYRINGE | Freq: Once | SUBCUTANEOUS | Status: AC
  Administered 2023-05-13: 210 mg via SUBCUTANEOUS

## 2023-04-28 NOTE — Telephone Encounter (Signed)
 Call to patient. Patient requesting to return call at a later time.

## 2023-04-28 NOTE — Telephone Encounter (Signed)
 Call to patient. Summary of benefits reviewed with patient as seen below and she verbalized understanding. Patient scheduled for first Evenity injection on 05/08/23 at 1115. Patient agreeable to date and time of appointment. Order placed and summary of benefits and PA scanned into Epic.   Encounter closed.

## 2023-05-06 DIAGNOSIS — F4323 Adjustment disorder with mixed anxiety and depressed mood: Secondary | ICD-10-CM | POA: Diagnosis not present

## 2023-05-07 ENCOUNTER — Ambulatory Visit: Payer: Medicare PPO

## 2023-05-08 ENCOUNTER — Ambulatory Visit: Payer: Medicare PPO

## 2023-05-13 ENCOUNTER — Ambulatory Visit

## 2023-05-13 DIAGNOSIS — M810 Age-related osteoporosis without current pathological fracture: Secondary | ICD-10-CM | POA: Diagnosis not present

## 2023-05-13 NOTE — Progress Notes (Signed)
 Patient is in office today for a nurse visit for  Evenity . Patient injection was given in the Lt & Rt arm. Pt tolerated injection well.

## 2023-05-20 DIAGNOSIS — F4323 Adjustment disorder with mixed anxiety and depressed mood: Secondary | ICD-10-CM | POA: Diagnosis not present

## 2023-06-04 DIAGNOSIS — F4323 Adjustment disorder with mixed anxiety and depressed mood: Secondary | ICD-10-CM | POA: Diagnosis not present

## 2023-06-15 ENCOUNTER — Telehealth: Payer: Self-pay | Admitting: *Deleted

## 2023-06-15 ENCOUNTER — Ambulatory Visit (INDEPENDENT_AMBULATORY_CARE_PROVIDER_SITE_OTHER)

## 2023-06-15 DIAGNOSIS — F4323 Adjustment disorder with mixed anxiety and depressed mood: Secondary | ICD-10-CM | POA: Diagnosis not present

## 2023-06-15 DIAGNOSIS — M810 Age-related osteoporosis without current pathological fracture: Secondary | ICD-10-CM | POA: Diagnosis not present

## 2023-06-15 MED ORDER — ROMOSOZUMAB-AQQG 105 MG/1.17ML ~~LOC~~ SOSY
210.0000 mg | PREFILLED_SYRINGE | Freq: Once | SUBCUTANEOUS | Status: AC
  Administered 2023-06-15: 210 mg via SUBCUTANEOUS

## 2023-06-15 NOTE — Telephone Encounter (Signed)
 Patient calling to schedule next Evenity injection. PA approved through 03/02/24. No new medical history updates. Patient aware of $40 copay due at time of visit. Injection scheduled for 06/15/23 at 1345. Patient agreeable to date and time of appointment.   Order placed. Encounter closed.

## 2023-06-15 NOTE — Progress Notes (Signed)
 Patient is in office today for a nurse visit for  Evenity . Patient injection was given in the Rt arm. Pt tolerated injection well.

## 2023-07-01 DIAGNOSIS — F4323 Adjustment disorder with mixed anxiety and depressed mood: Secondary | ICD-10-CM | POA: Diagnosis not present

## 2023-07-07 ENCOUNTER — Other Ambulatory Visit: Payer: Self-pay | Admitting: *Deleted

## 2023-07-07 DIAGNOSIS — M810 Age-related osteoporosis without current pathological fracture: Secondary | ICD-10-CM

## 2023-07-07 MED ORDER — ROMOSOZUMAB-AQQG 105 MG/1.17ML ~~LOC~~ SOSY
210.0000 mg | PREFILLED_SYRINGE | SUBCUTANEOUS | Status: AC
  Administered 2023-07-15 – 2023-09-22 (×3): 210 mg via SUBCUTANEOUS

## 2023-07-14 ENCOUNTER — Ambulatory Visit

## 2023-07-15 ENCOUNTER — Ambulatory Visit

## 2023-07-15 DIAGNOSIS — F4323 Adjustment disorder with mixed anxiety and depressed mood: Secondary | ICD-10-CM | POA: Diagnosis not present

## 2023-07-15 DIAGNOSIS — M810 Age-related osteoporosis without current pathological fracture: Secondary | ICD-10-CM

## 2023-07-16 DIAGNOSIS — M0589 Other rheumatoid arthritis with rheumatoid factor of multiple sites: Secondary | ICD-10-CM | POA: Diagnosis not present

## 2023-07-17 LAB — LAB REPORT - SCANNED: EGFR (Non-African Amer.): 93

## 2023-07-30 DIAGNOSIS — F4323 Adjustment disorder with mixed anxiety and depressed mood: Secondary | ICD-10-CM | POA: Diagnosis not present

## 2023-08-12 DIAGNOSIS — F4323 Adjustment disorder with mixed anxiety and depressed mood: Secondary | ICD-10-CM | POA: Diagnosis not present

## 2023-08-14 ENCOUNTER — Telehealth: Payer: Self-pay | Admitting: Obstetrics and Gynecology

## 2023-08-14 DIAGNOSIS — E2839 Other primary ovarian failure: Secondary | ICD-10-CM

## 2023-08-14 NOTE — Telephone Encounter (Signed)
Needs bone scan

## 2023-08-17 ENCOUNTER — Telehealth: Payer: Self-pay | Admitting: Obstetrics and Gynecology

## 2023-08-17 NOTE — Telephone Encounter (Signed)
 Needs dxa scan one year from starting evenity  Dr. Tia Flowers

## 2023-08-18 ENCOUNTER — Ambulatory Visit (INDEPENDENT_AMBULATORY_CARE_PROVIDER_SITE_OTHER)

## 2023-08-18 DIAGNOSIS — M810 Age-related osteoporosis without current pathological fracture: Secondary | ICD-10-CM

## 2023-08-27 DIAGNOSIS — F4323 Adjustment disorder with mixed anxiety and depressed mood: Secondary | ICD-10-CM | POA: Diagnosis not present

## 2023-09-09 DIAGNOSIS — F4323 Adjustment disorder with mixed anxiety and depressed mood: Secondary | ICD-10-CM | POA: Diagnosis not present

## 2023-09-22 ENCOUNTER — Ambulatory Visit (INDEPENDENT_AMBULATORY_CARE_PROVIDER_SITE_OTHER)

## 2023-09-22 DIAGNOSIS — M810 Age-related osteoporosis without current pathological fracture: Secondary | ICD-10-CM | POA: Diagnosis not present

## 2023-09-23 DIAGNOSIS — F4323 Adjustment disorder with mixed anxiety and depressed mood: Secondary | ICD-10-CM | POA: Diagnosis not present

## 2023-10-08 DIAGNOSIS — F4323 Adjustment disorder with mixed anxiety and depressed mood: Secondary | ICD-10-CM | POA: Diagnosis not present

## 2023-10-15 ENCOUNTER — Encounter: Payer: Self-pay | Admitting: *Deleted

## 2023-10-21 DIAGNOSIS — F4323 Adjustment disorder with mixed anxiety and depressed mood: Secondary | ICD-10-CM | POA: Diagnosis not present

## 2023-10-29 DIAGNOSIS — E559 Vitamin D deficiency, unspecified: Secondary | ICD-10-CM | POA: Diagnosis not present

## 2023-10-29 DIAGNOSIS — M0589 Other rheumatoid arthritis with rheumatoid factor of multiple sites: Secondary | ICD-10-CM | POA: Diagnosis not present

## 2023-10-29 DIAGNOSIS — M1991 Primary osteoarthritis, unspecified site: Secondary | ICD-10-CM | POA: Diagnosis not present

## 2023-10-29 DIAGNOSIS — M79671 Pain in right foot: Secondary | ICD-10-CM | POA: Diagnosis not present

## 2023-10-29 DIAGNOSIS — M8580 Other specified disorders of bone density and structure, unspecified site: Secondary | ICD-10-CM | POA: Diagnosis not present

## 2023-10-29 DIAGNOSIS — M25551 Pain in right hip: Secondary | ICD-10-CM | POA: Diagnosis not present

## 2023-10-29 DIAGNOSIS — Z6824 Body mass index (BMI) 24.0-24.9, adult: Secondary | ICD-10-CM | POA: Diagnosis not present

## 2023-11-04 ENCOUNTER — Telehealth: Payer: Self-pay | Admitting: *Deleted

## 2023-11-04 DIAGNOSIS — F4323 Adjustment disorder with mixed anxiety and depressed mood: Secondary | ICD-10-CM | POA: Diagnosis not present

## 2023-11-04 NOTE — Telephone Encounter (Signed)
 Returned call to [facility] after voicemail left from [facility] stating patient responsibility for Evenity  is $40. Spoke with Sharyle, representative and provided J code and administration code for Evenity . Sharyle states unable to provide benefits, would need to refer to provider fee schedule. Reviewed with [place], since conflicting information still being given, patient can pay $40 for this injection, but will need to sign waiver with the understanding there may be additional patient responsibility.   Message left to return call to [place] at [phone].

## 2023-11-05 ENCOUNTER — Ambulatory Visit: Payer: Medicare PPO

## 2023-11-05 VITALS — Ht <= 58 in | Wt 123.0 lb

## 2023-11-05 DIAGNOSIS — Z1159 Encounter for screening for other viral diseases: Secondary | ICD-10-CM | POA: Diagnosis not present

## 2023-11-05 DIAGNOSIS — Z0000 Encounter for general adult medical examination without abnormal findings: Secondary | ICD-10-CM | POA: Diagnosis not present

## 2023-11-05 NOTE — Patient Instructions (Addendum)
 Sylvia Reynolds , Thank you for taking time out of your busy schedule to complete your Annual Wellness Visit with me. I enjoyed our conversation and look forward to speaking with you again next year. I, as well as your care team,  appreciate your ongoing commitment to your health goals. Please review the following plan we discussed and let me know if I can assist you in the future. Your Game plan/ To Do List    Referrals: If you haven't heard from the office you've been referred to, please reach out to them at the phone provided.   Follow up Visits: We will see or speak with you next year for your Next Medicare AWV with our clinical staff Have you seen your provider in the last 6 months (3 months if uncontrolled diabetes)? No  Clinician Recommendations:  Aim for 30 minutes of exercise or brisk walking, 6-8 glasses of water, and 5 servings of fruits and vegetables each day. Educated nd advised on getting the Pneumonia and Influenza vaccines in 2025.      This is a list of the screenings recommended for you:  Health Maintenance  Topic Date Due   Hepatitis C Screening  Never done   Pneumococcal Vaccine for age over 75 (1 of 1 - PCV) Never done   Flu Shot  10/02/2023   COVID-19 Vaccine (5 - Pfizer risk 2024-25 season) 11/02/2023   Colon Cancer Screening  09/20/2024   Medicare Annual Wellness Visit  11/04/2024   Mammogram  12/24/2024   DTaP/Tdap/Td vaccine (2 - Td or Tdap) 04/15/2032   DEXA scan (bone density measurement)  Completed   Zoster (Shingles) Vaccine  Completed   HPV Vaccine  Aged Out   Meningitis B Vaccine  Aged Out    Advanced directives: (Copy Requested) Please bring a copy of your health care power of attorney and living will to the office to be added to your chart at your convenience. You can mail to [facility] [address] [place], [place] [address] or email to [email or web address] Advance Care Planning is important because it:  [x]  Makes sure you  receive the medical care that is consistent with your values, goals, and preferences  [x]  It provides guidance to your family and loved ones and reduces their decisional burden about whether or not they are making the right decisions based on your wishes.  Follow the link provided in your after visit summary or read over the paperwork we have mailed to you to help you started getting your Advance Directives in place. If you need assistance in completing these, please reach out to us  so that we can help you!

## 2023-11-05 NOTE — Progress Notes (Signed)
 Subjective:   Sylvia Reynolds is a 73 y.o. who presents for a Medicare Wellness preventive visit.  As a reminder, Annual Wellness Visits don't include a physical exam, and some assessments may be limited, especially if this visit is performed virtually. We may recommend an in-person follow-up visit with your provider if needed.  Visit Complete: Virtual I connected with  Jenkins HERO Dacy on 11/05/23 by a audio enabled telemedicine application and verified that I am speaking with the correct person using two identifiers.  Patient Location: Home  Provider Location: Office/Clinic  I discussed the limitations of evaluation and management by telemedicine. The patient expressed understanding and agreed to proceed.  Vital Signs: Because this visit was a virtual/telehealth visit, some criteria may be missing or patient reported. Any vitals not documented were not able to be obtained and vitals that have been documented are patient reported.  VideoDeclined- This patient declined Librarian, academic. Therefore the visit was completed with audio only.  Persons Participating in Visit: Patient.  AWV Questionnaire: No: Patient Medicare AWV questionnaire was not completed prior to this visit.  Cardiac Risk Factors include: advanced age (>70men, >71 women);hypertension     Objective:    Today's Vitals   11/05/23 1543  Weight: 123 lb (55.8 kg)  Height: 4' 10 (1.473 m)   Body mass index is 25.71 kg/m.     11/05/2023    3:45 PM 11/04/2022    2:43 PM 07/09/2021   10:26 AM 06/02/2021    5:24 PM 07/01/2018    6:33 AM 06/30/2018    9:45 AM 02/04/2016    3:34 PM  Advanced Directives  Does Patient Have a Medical Advance Directive? Yes Yes Yes Yes No No No   Type of Estate agent of [place];Living will Healthcare Power of [place];Living will  Healthcare Power of [place];Living will     Does patient want to make changes to medical advance directive?   No - Patient  declined      Copy of Healthcare Power of Attorney in Chart? No - copy requested No - copy requested   No - copy requested  No - copy requested    Would patient like information on creating a medical advance directive?    No - Patient declined No - Patient declined  No - Patient declined  No - Patient declined      Data saved with a previous flowsheet row definition    Current Medications (verified) Outpatient Encounter Medications as of 11/05/2023  Medication Sig   Ascorbic Acid (VITAMIN C PO) Take by mouth.   atenolol (TENORMIN) 25 MG tablet TAKE 1 TABLET BY MOUTH EVERY DAY   benazepril (LOTENSIN) 20 MG tablet TAKE 1 TABLET BY MOUTH EVERY DAY   calcium citrate-vitamin D (CITRACAL+D) 315-200 MG-UNIT tablet Take 2 tablets by mouth daily.   cholecalciferol (VITAMIN D3) 25 MCG (1000 UNIT) tablet    diclofenac (VOLTAREN) 75 MG EC tablet Take 75 mg by mouth daily.   folic acid (FOLVITE) 1 MG tablet Take 1 mg by mouth daily.   loratadine (CLARITIN) 10 MG tablet Take 10 mg by mouth daily.   Lysine 1000 MG TABS Take 1 tablet by mouth daily.   methotrexate 2.5 MG tablet Take 15 mg by mouth once a week.   Multiple Vitamins-Minerals (MULTI COMPLETE/IRON PO) Take 1 tablet by mouth 2 (two) times daily.   Omega-3 Fatty Acids (FISH OIL) 1200 MG CAPS Take 2 capsules by mouth daily.  pantoprazole  (PROTONIX ) 20 MG tablet TAKE 1 TABLET IN THE MORNING AND 1 TABLET IN THE EVENING AS NEEDED.   polyethylene glycol powder (GLYCOLAX/MIRALAX) 17 GM/SCOOP powder Take 0.5 Containers by mouth daily.   rosuvastatin (CRESTOR) 20 MG tablet TAKE 1 TABLET BY MOUTH EVERY DAY FOR 90 DAYS   Facility-Administered Encounter Medications as of 11/05/2023  Medication   denosumab  (PROLIA ) injection 60 mg   Romosozumab -aqqg (EVENITY ) 105 MG/1. injection 210 mg   Romosozumab -aqqg (EVENITY ) 105 MG/1. injection 210 mg    Allergies (verified) Zithromax [azithromycin], Ceftin [cefuroxime axetil], and Tape   History: Past  Medical History:  Diagnosis Date   Arthritis    Closed fracture dislocation of right elbow    GERD (gastroesophageal reflux disease)    Hypertension    Osteopenia    Rectal prolapse    Rheumatoid arthritis (HCC)    Scoliosis    Shingles    UTI (urinary tract infection)    Past Surgical History:  Procedure Laterality Date   CESAREAN SECTION     COLONOSCOPY     ESOPHAGOGASTRODUODENOSCOPY     ORIF ELBOW FRACTURE Right 07/01/2018   Procedure: OPEN REDUCTION INTERNAL FIXATION (ORIF) RIGHT ELBOW/OLECRANON FRACTURE;  Surgeon: Shari Sieving, MD;  Location: [facility];  Service: Orthopedics;  Laterality: Right;   Family History  Problem Relation Age of Onset   Hypertension Mother    Parkinson's disease Mother    Cancer Father        prostate, chronic lymph leukemia   Heart failure Father    Hypertension Father    Hypertension Sister    Cancer Brother        CLL   Kidney Stones Brother    Thyroid  disease Maternal Grandfather    Breast cancer Maternal Aunt    Breast cancer Maternal Aunt    Breast cancer Maternal Aunt    Breast cancer Cousin    Colon cancer Other        1st cousin   Stomach cancer Neg Hx    Esophageal cancer Neg Hx    Social History   Socioeconomic History   Marital status: Married    Spouse name: Alm   Number of children: 2   Years of education: Not on file   Highest education level: Master's degree (e.g., MA, MS, MEng, MEd, MSW, MBA)  Occupational History   Occupation: retired  Tobacco Use   Smoking status: Never   Smokeless tobacco: Never  Vaping Use   Vaping status: Never Used  Substance and Sexual Activity   Alcohol use: Not Currently    Alcohol/week: 1.0 standard drink of alcohol    Types: 1 Glasses of wine per week    Comment: occa   Drug use: No   Sexual activity: Not Currently    Partners: Male    Birth control/protection: Post-menopausal    Comment: First sexual encounter at 73 yrs old. Few than 5 partners.  Other  Topics Concern   Not on file  Social History Narrative   Adult daughter lives there, she is physically handicapped   Social Drivers of Corporate investment banker Strain: Low Risk  (11/05/2023)   Overall Financial Resource Strain (CARDIA)    Difficulty of Paying Living Expenses: Not hard at all  Food Insecurity: No Food Insecurity (11/05/2023)   Hunger Vital Sign    Worried About Running Out of Food in the Last Year: Never true    Ran Out of Food in the Last Year: Never true  Transportation Needs: No Transportation Needs (11/05/2023)   PRAPARE - Administrator, Civil Service (Medical): No    Lack of Transportation (Non-Medical): No  Physical Activity: Sufficiently Active (11/05/2023)   Exercise Vital Sign    Days of Exercise per Week: 7 days    Minutes of Exercise per Session: 30 min  Stress: Stress Concern Present (11/05/2023)   [employer] of Occupational Health - Occupational Stress Questionnaire    Feeling of Stress: To some extent  Social Connections: Socially Integrated (11/05/2023)   Social Connection and Isolation Panel    Frequency of Communication with Friends and Family: More than three times a week    Frequency of Social Gatherings with Friends and Family: Once a week    Attends Religious Services: More than 4 times per year    Active Member of [employer] or Organizations: Yes    Attends Engineer, structural: More than 4 times per year    Marital Status: Married    Tobacco Counseling Counseling given: Not Answered    Clinical Intake:  Pre-visit preparation completed: Yes  Pain : No/denies pain     BMI - recorded: 25.71 Nutritional Status: BMI 25 -29 Overweight Nutritional Risks: None Diabetes: No  No results found for: HGBA1C   How often do you need to have someone help you when you read instructions, pamphlets, or other written materials from your doctor or pharmacy?: 1 - Never  Interpreter Needed?: No  Information entered by ::  Verdie Saba, CMA   Activities of Daily Living     11/05/2023    3:47 PM  In your present state of health, do you have any difficulty performing the following activities:  Hearing? 0  Vision? 0  Difficulty concentrating or making decisions? 0  Walking or climbing stairs? 0  Dressing or bathing? 0  Doing errands, shopping? 0  Preparing Food and eating ? N  Using the Toilet? N  In the past six months, have you accidently leaked urine? Y  Comment wears a pantyliner  Do you have problems with loss of bowel control? N  Managing your Medications? N  Managing your Finances? N  Housekeeping or managing your Housekeeping? N    Patient Care Team: Elnor Lauraine BRAVO, NP as PCP - General (Nurse Practitioner) Glennon Almarie POUR, MD as Consulting Physician (Obstetrics and Gynecology) Charmayne Molly, MD as Consulting Physician (Ophthalmology)  I have updated your Care Teams any recent Medical Services you may have received from other providers in the past year.     Assessment:   This is a routine wellness examination for [place]ian.  Hearing/Vision screen Hearing Screening - Comments:: Denies hearing difficulties   Vision Screening - Comments:: Wears eyeglasses for reading - up to date with routine eye exams with Molly Charmayne     Goals Addressed               This Visit's Progress     Patient Stated (pt-stated)        Patient stated she plans to continue exercising - joined [employer]       Depression Screen     11/05/2023    3:49 PM 02/18/2023    2:32 PM 11/04/2022    2:51 PM 09/12/2022    1:45 PM  PHQ 2/9 Scores  PHQ - 2 Score 0 0 1 2  PHQ- 9 Score 1  3 7     Fall Risk     11/05/2023    3:48  PM 02/18/2023    2:31 PM 01/08/2023   10:48 AM 11/04/2022    2:44 PM 09/12/2022    1:25 PM  Fall Risk   Falls in the past year? 0 0 1 1 0  Number falls in past yr: 0 0 0 0 0  Injury with Fall? 0 0 1 1 0  Comment   sprain & bruises    Risk for fall due to : No Fall Risks No  Fall Risks No Fall Risks  No Fall Risks  Follow up Falls evaluation completed;Falls prevention discussed Falls evaluation completed Falls evaluation completed Falls evaluation completed;Falls prevention discussed Falls evaluation completed    MEDICARE RISK AT HOME:  Medicare Risk at Home Any stairs in or around the home?: Yes If so, are there any without handrails?: No Home free of loose throw rugs in walkways, pet beds, electrical cords, etc?: Yes Adequate lighting in your home to reduce risk of falls?: Yes Life alert?: No Use of a cane, walker or w/c?: No Grab bars in the bathroom?: Yes Shower chair or bench in shower?: No Elevated toilet seat or a handicapped toilet?: Yes  TIMED UP AND GO:  Was the test performed?  No  Cognitive Function: 6CIT completed        11/05/2023    3:53 PM 11/04/2022    2:46 PM  6CIT Screen  What Year? 0 points 0 points  What month? 0 points 0 points  What time? 0 points 0 points  Count back from 20 0 points 0 points  Months in reverse 0 points 0 points  Repeat phrase 0 points 0 points  Total Score 0 points 0 points    Immunizations Immunization History  Administered Date(s) Administered   Fluad Trivalent(High Dose 65+) 11/28/2022   Influenza Inj Mdck Quad Pf 12/30/2016   Moderna SARS-COV2 Booster Vaccination 06/28/2020   PFIZER(Purple Top)SARS-COV-2 Vaccination 04/09/2019, 05/03/2019, 12/27/2019   Tdap 04/15/2022   Unspecified SARS-COV-2 Vaccination 02/07/2023   Zoster Recombinant(Shingrix) 11/11/2020, 10/22/2022    Screening Tests Health Maintenance  Topic Date Due   Hepatitis C Screening  Never done   Pneumococcal Vaccine: 50+ Years (1 of 1 - PCV) Never done   INFLUENZA VACCINE  10/02/2023   COVID-19 Vaccine (5 - Pfizer risk 2024-25 season) 11/02/2023   Colonoscopy  09/20/2024   Medicare Annual Wellness (AWV)  11/04/2024   MAMMOGRAM  12/24/2024   DTaP/Tdap/Td (2 - Td or Tdap) 04/15/2032   DEXA SCAN  Completed   Zoster Vaccines-  Shingrix  Completed   HPV VACCINES  Aged Out   Meningococcal B Vaccine  Aged Out    Health Maintenance  Health Maintenance Due  Topic Date Due   Hepatitis C Screening  Never done   Pneumococcal Vaccine: 50+ Years (1 of 1 - PCV) Never done   INFLUENZA VACCINE  10/02/2023   COVID-19 Vaccine (5 - Pfizer risk 2024-25 season) 11/02/2023   Health Maintenance Items Addressed:   Additional Screening:  Vision Screening: Recommended annual ophthalmology exams for early detection of glaucoma and other disorders of the eye. Would you like a referral to an eye doctor? No    Dental Screening: Recommended annual dental exams for proper oral hygiene  Community Resource Referral / Chronic Care Management: CRR required this visit?  No   CCM required this visit?  No   Plan:    I have personally reviewed and noted the following in the patient's chart:   Medical and social history Use of alcohol, tobacco  or illicit drugs  Current medications and supplements including opioid prescriptions. Patient is not currently taking opioid prescriptions. Functional ability and status Nutritional status Physical activity Advanced directives List of other physicians Hospitalizations, surgeries, and ER visits in previous 12 months Vitals Screenings to include cognitive, depression, and falls Referrals and appointments  In addition, I have reviewed and discussed with patient certain preventive protocols, quality metrics, and best practice recommendations. A written personalized care plan for preventive services as well as general preventive health recommendations were provided to patient.   Verdie CHRISTELLA Saba, CMA   11/05/2023   After Visit Summary: (MyChart) Due to this being a telephonic visit, the after visit summary with patients personalized plan was offered to patient via MyChart   Notes: Nothing significant to report at this time.

## 2023-11-17 DIAGNOSIS — F4323 Adjustment disorder with mixed anxiety and depressed mood: Secondary | ICD-10-CM | POA: Diagnosis not present

## 2023-11-19 ENCOUNTER — Other Ambulatory Visit: Payer: Self-pay | Admitting: Nurse Practitioner

## 2023-11-19 DIAGNOSIS — Z0000 Encounter for general adult medical examination without abnormal findings: Secondary | ICD-10-CM

## 2023-11-25 ENCOUNTER — Ambulatory Visit

## 2023-11-26 ENCOUNTER — Ambulatory Visit

## 2023-11-26 DIAGNOSIS — M810 Age-related osteoporosis without current pathological fracture: Secondary | ICD-10-CM | POA: Diagnosis not present

## 2023-12-03 DIAGNOSIS — F4323 Adjustment disorder with mixed anxiety and depressed mood: Secondary | ICD-10-CM | POA: Diagnosis not present

## 2023-12-08 ENCOUNTER — Other Ambulatory Visit: Payer: Self-pay | Admitting: Gastroenterology

## 2023-12-08 DIAGNOSIS — K219 Gastro-esophageal reflux disease without esophagitis: Secondary | ICD-10-CM

## 2023-12-10 ENCOUNTER — Ambulatory Visit: Admitting: Nurse Practitioner

## 2023-12-10 VITALS — BP 118/70 | HR 67 | Temp 97.9°F | Ht <= 58 in | Wt 124.5 lb

## 2023-12-10 DIAGNOSIS — E785 Hyperlipidemia, unspecified: Secondary | ICD-10-CM

## 2023-12-10 DIAGNOSIS — H6123 Impacted cerumen, bilateral: Secondary | ICD-10-CM

## 2023-12-10 DIAGNOSIS — K219 Gastro-esophageal reflux disease without esophagitis: Secondary | ICD-10-CM

## 2023-12-10 DIAGNOSIS — M069 Rheumatoid arthritis, unspecified: Secondary | ICD-10-CM

## 2023-12-10 DIAGNOSIS — E559 Vitamin D deficiency, unspecified: Secondary | ICD-10-CM

## 2023-12-10 DIAGNOSIS — M810 Age-related osteoporosis without current pathological fracture: Secondary | ICD-10-CM

## 2023-12-10 DIAGNOSIS — I10 Essential (primary) hypertension: Secondary | ICD-10-CM | POA: Diagnosis not present

## 2023-12-10 DIAGNOSIS — Z23 Encounter for immunization: Secondary | ICD-10-CM

## 2023-12-10 NOTE — Assessment & Plan Note (Signed)
 Cerumen impaction Wax present, causing heaviness in ears. - Perform bilateral ear lavage to clear wax.

## 2023-12-10 NOTE — Assessment & Plan Note (Signed)
 Hyperlipidemia On rosuvastatin. Lipid panel and liver enzymes need updating. - Order fasting lipid panel. - Order liver function tests. - Return for fasting blood draw any morning Monday through Friday, 8 to 4:30.

## 2023-12-10 NOTE — Addendum Note (Signed)
 Addended by: LEAR, Antoniette Peake P on: 12/10/2023 04:07 PM   Modules accepted: Orders

## 2023-12-10 NOTE — Assessment & Plan Note (Signed)
 Gastroesophageal reflux disease Heartburn managed with pantoprazole . - Continue pantoprazole 

## 2023-12-10 NOTE — Assessment & Plan Note (Signed)
 Continue to follow-up with rheumatology. Labs ordered, further recommendations may be made based upon these results

## 2023-12-10 NOTE — Assessment & Plan Note (Signed)
 Osteoporosis Completed six Evenity  injections. Insurance may affect continuation. Discussed alternative treatments. - Discuss insurance coverage for Evenity  with OB GYN. - Order vitamin D level. - Encourage continuation of weight-bearing exercises.

## 2023-12-10 NOTE — Progress Notes (Signed)
 Established Patient Office Visit  Subjective   Patient ID: Sylvia Reynolds, female    DOB: 02/23/1951  Age: 73 y.o. MRN: [id number]  Chief Complaint  Patient presents with   Hyperlipidemia    Discussed the use of AI scribe software for clinical note transcription with the patient, who gave verbal consent to proceed.  History of Present Illness Sylvia Reynolds is a 73 year old female who presents for a routine follow-up visit.  Hypertension - Takes atenolol 25 mg and benazepril 20 mg daily at night - Blood pressure well controlled and stable on current regimen  Cerumen impaction symptoms - Sensation of heaviness in ears - Symptoms less severe than previous episodes  Gastroesophageal reflux disease - Heartburn well-controlled with pantoprazole  20mg  BID  Hyperlipidemia - Takes rosuvastatin 20 mg daily for cholesterol management -Due for fasting lipid panel, she is not fasting today  Osteoporosis management - Completed six injections of Evenity  - Concerned about insurance coverage for future Evenity  doses - Engages in weight-bearing exercises at [employer] status - Would like flu vaccine to be administered today - Plans to receive COVID-19 vaccination in a couple of weeks at pharmacy      Review of Systems  Respiratory:  Negative for shortness of breath.   Cardiovascular:  Negative for chest pain.      Objective:     BP 118/70   Pulse 67   Temp 97.9 F (36.6 C) (Temporal)   Ht 4' 10 (1.473 m)   Wt 124 lb 8 oz (56.5 kg)   SpO2 99%   BMI 26.02 kg/m  BP Readings from Last 3 Encounters:  12/10/23 118/70  02/18/23 116/74  01/08/23 124/80   Wt Readings from Last 3 Encounters:  12/10/23 124 lb 8 oz (56.5 kg)  11/05/23 123 lb (55.8 kg)  02/18/23 121 lb (54.9 kg)      Physical Exam Vitals reviewed.  Constitutional:      General: She is not in acute distress.    Appearance: Normal appearance.  HENT:     Head: Normocephalic and atraumatic.   Cardiovascular:     Rate and Rhythm: Normal rate and regular rhythm.     Pulses: Normal pulses.     Heart sounds: Normal heart sounds.  Pulmonary:     Effort: Pulmonary effort is normal.     Breath sounds: Normal breath sounds.  Skin:    General: Skin is warm and dry.  Neurological:     General: No focal deficit present.     Mental Status: She is alert and oriented to person, place, and time.  Psychiatric:        Mood and Affect: Mood normal.        Behavior: Behavior normal.        Judgment: Judgment normal.      No results found for any visits on 12/10/23.    The ASCVD Risk score (Arnett DK, et al., 2019) failed to calculate for the following reasons:   Cannot find a previous HDL lab   Cannot find a previous total cholesterol lab    Assessment & Plan:   Problem List Items Addressed This Visit       Cardiovascular and Mediastinum   Hypertension with normal renal function   Hypertension Blood pressure controlled with atenolol and benazepril, improved with nighttime dosing. - Continue atenolol 25 mg daily and benazepril 20 mg daily, taken at night.      Relevant Orders   CBC with  Differential/Platelet     Digestive   Gastroesophageal reflux disease   Gastroesophageal reflux disease Heartburn managed with pantoprazole . - Continue pantoprazole        Relevant Orders   CBC with Differential/Platelet     Nervous and Auditory   Cerumen impaction   Cerumen impaction Wax present, causing heaviness in ears. - Perform bilateral ear lavage to clear wax.        Musculoskeletal and Integument   RA (rheumatoid arthritis) (HCC)   Continue to follow-up with rheumatology. Labs ordered, further recommendations may be made based upon these results       Relevant Orders   CBC with Differential/Platelet   Osteoporosis without current pathological fracture - Primary   Osteoporosis Completed six Evenity  injections. Insurance may affect continuation. Discussed  alternative treatments. - Discuss insurance coverage for Evenity  with OB GYN. - Order vitamin D level. - Encourage continuation of weight-bearing exercises.      Relevant Medications   Romosozumab -aqqg (EVENITY ) 105 MG/1. SOSY injection   Other Relevant Orders   Lipid panel   Comprehensive metabolic panel with GFR   VITAMIN D 25 Hydroxy (Vit-D Deficiency, Fractures)   CBC with Differential/Platelet     Other   Vitamin D deficiency   Relevant Orders   Lipid panel   Comprehensive metabolic panel with GFR   VITAMIN D 25 Hydroxy (Vit-D Deficiency, Fractures)   CBC with Differential/Platelet   HLD (hyperlipidemia)   Hyperlipidemia On rosuvastatin. Lipid panel and liver enzymes need updating. - Order fasting lipid panel. - Order liver function tests. - Return for fasting blood draw any morning Monday through Friday, 8 to 4:30.      Relevant Orders   Lipid panel   Comprehensive metabolic panel with GFR   VITAMIN D 25 Hydroxy (Vit-D Deficiency, Fractures)   CBC with Differential/Platelet   Needs flu shot   Relevant Orders   CBC with Differential/Platelet  Assessment and Plan Assessment & Plan Hypertension Blood pressure controlled with atenolol and benazepril, improved with nighttime dosing. - Continue atenolol 25 mg daily and benazepril 20 mg daily, taken at night.  Hyperlipidemia On rosuvastatin. Lipid panel and liver enzymes need updating. - Order fasting lipid panel. - Order liver function tests. - Return for fasting blood draw any morning Monday through Friday, 8 to 4:30.  Gastroesophageal reflux disease Heartburn managed with pantoprazole . - Continue pantoprazole    Osteoporosis Completed six Evenity  injections. Insurance may affect continuation. Discussed alternative treatments. - Discuss insurance coverage for Evenity  with OB GYN. - Order vitamin D level. - Encourage continuation of weight-bearing exercises.  Cerumen impaction Wax present, causing  heaviness in ears. - Perform bilateral ear lavage to clear wax.    Return in about 6 months (around 06/09/2024) for F/U with Merick Kelleher.    Lauraine FORBES Pereyra, NP

## 2023-12-10 NOTE — Assessment & Plan Note (Signed)
 Hypertension Blood pressure controlled with atenolol and benazepril, improved with nighttime dosing. - Continue atenolol 25 mg daily and benazepril 20 mg daily, taken at night.

## 2023-12-17 DIAGNOSIS — F4323 Adjustment disorder with mixed anxiety and depressed mood: Secondary | ICD-10-CM | POA: Diagnosis not present

## 2023-12-28 ENCOUNTER — Other Ambulatory Visit (HOSPITAL_BASED_OUTPATIENT_CLINIC_OR_DEPARTMENT_OTHER): Payer: Self-pay

## 2023-12-28 MED ORDER — COMIRNATY 30 MCG/0.3ML IM SUSY
0.3000 mL | PREFILLED_SYRINGE | Freq: Once | INTRAMUSCULAR | 0 refills | Status: AC
  Filled 2023-12-28: qty 0.3, 1d supply, fill #0

## 2023-12-31 DIAGNOSIS — F4323 Adjustment disorder with mixed anxiety and depressed mood: Secondary | ICD-10-CM | POA: Diagnosis not present

## 2024-01-05 ENCOUNTER — Other Ambulatory Visit

## 2024-01-05 ENCOUNTER — Ambulatory Visit
Admission: RE | Admit: 2024-01-05 | Discharge: 2024-01-05 | Disposition: A | Discharge status: Home / Self Care | Source: Ambulatory Visit | Attending: Nurse Practitioner | Admitting: Nurse Practitioner

## 2024-01-05 DIAGNOSIS — I10 Essential (primary) hypertension: Secondary | ICD-10-CM

## 2024-01-05 DIAGNOSIS — K219 Gastro-esophageal reflux disease without esophagitis: Secondary | ICD-10-CM

## 2024-01-05 DIAGNOSIS — Z1159 Encounter for screening for other viral diseases: Secondary | ICD-10-CM

## 2024-01-05 DIAGNOSIS — Z1231 Encounter for screening mammogram for malignant neoplasm of breast: Secondary | ICD-10-CM | POA: Diagnosis not present

## 2024-01-05 DIAGNOSIS — E785 Hyperlipidemia, unspecified: Secondary | ICD-10-CM | POA: Diagnosis not present

## 2024-01-05 DIAGNOSIS — E559 Vitamin D deficiency, unspecified: Secondary | ICD-10-CM | POA: Diagnosis not present

## 2024-01-05 DIAGNOSIS — M069 Rheumatoid arthritis, unspecified: Secondary | ICD-10-CM | POA: Diagnosis not present

## 2024-01-05 DIAGNOSIS — M810 Age-related osteoporosis without current pathological fracture: Secondary | ICD-10-CM | POA: Diagnosis not present

## 2024-01-05 DIAGNOSIS — Z23 Encounter for immunization: Secondary | ICD-10-CM

## 2024-01-05 DIAGNOSIS — Z0000 Encounter for general adult medical examination without abnormal findings: Secondary | ICD-10-CM

## 2024-01-05 LAB — CBC WITH DIFFERENTIAL/PLATELET
Basophils Absolute: 0 K/uL (ref 0.0–0.1)
Basophils Relative: 0.8 % (ref 0.0–3.0)
Eosinophils Absolute: 0.1 K/uL (ref 0.0–0.7)
Eosinophils Relative: 3.1 % (ref 0.0–5.0)
HCT: 38.9 % (ref 36.0–46.0)
Hemoglobin: 13.3 g/dL (ref 12.0–15.0)
Lymphocytes Relative: 24.4 % (ref 12.0–46.0)
Lymphs Abs: 0.9 K/uL (ref 0.7–4.0)
MCHC: 34.2 g/dL (ref 30.0–36.0)
MCV: 97.3 fl (ref 78.0–100.0)
Monocytes Absolute: 0.5 K/uL (ref 0.1–1.0)
Monocytes Relative: 13.7 % — ABNORMAL HIGH (ref 3.0–12.0)
Neutro Abs: 2.2 K/uL (ref 1.4–7.7)
Neutrophils Relative %: 58 % (ref 43.0–77.0)
Platelets: 205 K/uL (ref 150.0–400.0)
RBC: 4 Mil/uL (ref 3.87–5.11)
RDW: 13.7 % (ref 11.5–15.5)
WBC: 3.8 K/uL — ABNORMAL LOW (ref 4.0–10.5)

## 2024-01-05 LAB — LIPID PANEL
Cholesterol: 128 mg/dL (ref 0–200)
HDL: 54.7 mg/dL (ref >39.00)
LDL Cholesterol: 61 mg/dL (ref 0–99)
NonHDL: 72.93
Total CHOL/HDL Ratio: 2
Triglycerides: 60 mg/dL (ref 0.0–149.0)
VLDL: 12 mg/dL (ref 0.0–40.0)

## 2024-01-05 LAB — COMPREHENSIVE METABOLIC PANEL WITH GFR
ALT: 29 U/L (ref 0–35)
AST: 28 U/L (ref 0–37)
Albumin: 4.3 g/dL (ref 3.5–5.2)
Alkaline Phosphatase: 45 U/L (ref 39–117)
BUN: 19 mg/dL (ref 6–23)
CO2: 32 meq/L (ref 19–32)
Calcium: 9 mg/dL (ref 8.4–10.5)
Chloride: 105 meq/L (ref 96–112)
Creatinine, Ser: 0.73 mg/dL (ref 0.40–1.20)
GFR: 82.87 mL/min (ref >60.00)
Glucose, Bld: 78 mg/dL (ref 70–99)
Potassium: 4 meq/L (ref 3.5–5.1)
Sodium: 142 meq/L (ref 135–145)
Total Bilirubin: 0.4 mg/dL (ref 0.2–1.2)
Total Protein: 6.1 g/dL (ref 6.0–8.3)

## 2024-01-05 LAB — BASIC METABOLIC PANEL WITH GFR
BUN: 19 mg/dL (ref 6–23)
CO2: 32 meq/L (ref 19–32)
Calcium: 9 mg/dL (ref 8.4–10.5)
Chloride: 105 meq/L (ref 96–112)
Creatinine, Ser: 0.73 mg/dL (ref 0.40–1.20)
GFR: 82.87 mL/min (ref >60.00)
Glucose, Bld: 78 mg/dL (ref 70–99)
Potassium: 4 meq/L (ref 3.5–5.1)
Sodium: 142 meq/L (ref 135–145)

## 2024-01-06 ENCOUNTER — Encounter: Payer: Self-pay | Admitting: *Deleted

## 2024-01-06 ENCOUNTER — Other Ambulatory Visit: Payer: Self-pay | Admitting: *Deleted

## 2024-01-06 DIAGNOSIS — M810 Age-related osteoporosis without current pathological fracture: Secondary | ICD-10-CM

## 2024-01-06 LAB — HEPATITIS C ANTIBODY: Hepatitis C Ab: NONREACTIVE

## 2024-01-06 LAB — VITAMIN D 25 HYDROXY (VIT D DEFICIENCY, FRACTURES): VITD: 72.77 ng/mL (ref 30.00–100.00)

## 2024-01-06 MED ORDER — ROMOSOZUMAB-AQQG 105 MG/1.17ML ~~LOC~~ SOSY
210.0000 mg | PREFILLED_SYRINGE | Freq: Once | SUBCUTANEOUS | 0 refills | Status: AC
  Filled 2024-01-21: qty 3.51, 1d supply, fill #0

## 2024-01-06 MED ORDER — ROMOSOZUMAB-AQQG 105 MG/1.17ML ~~LOC~~ SOSY
210.0000 mg | PREFILLED_SYRINGE | SUBCUTANEOUS | Status: AC
  Administered 2024-01-19: 210 mg via SUBCUTANEOUS

## 2024-01-07 ENCOUNTER — Ambulatory Visit: Payer: Self-pay | Admitting: Nurse Practitioner

## 2024-01-07 ENCOUNTER — Ambulatory Visit: Payer: Self-pay | Admitting: Obstetrics and Gynecology

## 2024-01-08 ENCOUNTER — Other Ambulatory Visit (HOSPITAL_COMMUNITY): Payer: Self-pay

## 2024-01-08 ENCOUNTER — Telehealth: Payer: Self-pay

## 2024-01-08 NOTE — Telephone Encounter (Signed)
 Proceed with MTDM services  Med obtained from pharmacy and shipped to clinic:  Pharmacy benefit: Copay $100 (Paid to pharmacy) Admin Fee: $40 (Pay at clinic)  Prior Auth: APPROVED PA# [id number] Expiration Date: 03/04/23-03/02/25  # of doses approved: 12   Patient NOT eligible for Copay Card. Copay Card can make patient's cost as little as $25. Link to apply: [email or web address]://[email or web address].[email or web address]  ** This summary of benefits is an estimation of the patient's out-of-pocket cost. Exact cost may very based on individual plan coverage.

## 2024-01-08 NOTE — Telephone Encounter (Addendum)
 PHARMACY PA SUBMITTED VIA LATENT. KEY: [id number]   APPROVED    PHARMACY COPAY: $100

## 2024-01-08 NOTE — Telephone Encounter (Signed)
 Sylvia Reynolds

## 2024-01-08 NOTE — Telephone Encounter (Signed)
 MEDICAL PA APPROVED

## 2024-01-11 ENCOUNTER — Ambulatory Visit: Attending: Obstetrics and Gynecology

## 2024-01-11 ENCOUNTER — Other Ambulatory Visit: Payer: Self-pay

## 2024-01-11 DIAGNOSIS — M810 Age-related osteoporosis without current pathological fracture: Secondary | ICD-10-CM

## 2024-01-11 NOTE — Progress Notes (Signed)
 [facility]  Referring Provider: Lauraine FORBES Pereyra  Virtual Visit via Telephone Note  I connected with Sylvia Reynolds on 01/11/24 at  9:30 AM EST by telephone and verified that I am speaking with the correct person using two identifiers.  Location: Patient: home Provider: office   I discussed the limitations, risks, security and privacy concerns of performing an evaluation and management service by telephone and the availability of in person appointments. I also discussed with the patient that there may be a patient responsible charge related to this service. The patient expressed understanding and agreed to proceed.   HPI: Sylvia Reynolds is a 73 y.o. female who presents to the pharmacotherapy clinic via telephone for continuation of therapy with Evenity  for osteoporosis.  Patient Active Problem List   Diagnosis Date Noted   Vitamin D deficiency 12/10/2023   HLD (hyperlipidemia) 12/10/2023   Needs flu shot 12/10/2023   Osteoporosis without current pathological fracture 02/18/2023   Cerumen impaction 02/18/2023   Pelvic floor dysfunction 01/07/2023   Scoliosis 09/12/2022   Burnout of caregiver 09/12/2022   Injury of right foot 04/15/2022   Abrasion of right ankle 04/15/2022   Need for tetanus booster 04/15/2022   Gastroesophageal reflux disease 07/11/2021   Rectal prolapse 07/11/2021   Screening for colon cancer 07/11/2021   RA (rheumatoid arthritis) (HCC) 08/11/2018   Hypertension with normal renal function 08/11/2018   Olecranon fracture 06/11/2018    Patient's Medications  New Prescriptions   No medications on file  Previous Medications   ASCORBIC ACID (VITAMIN C PO)    Take by mouth.   ATENOLOL (TENORMIN) 25 MG TABLET    TAKE 1 TABLET BY MOUTH EVERY DAY   BENAZEPRIL (LOTENSIN) 20 MG TABLET    TAKE 1 TABLET BY MOUTH EVERY DAY   CALCIUM CITRATE-VITAMIN D (CITRACAL+D) 315-200 MG-UNIT TABLET    Take 2 tablets by mouth daily.   CHOLECALCIFEROL (VITAMIN D3) 25 MCG  (1000 UNIT) TABLET       DICLOFENAC (VOLTAREN) 75 MG EC TABLET    Take 75 mg by mouth daily.   FOLIC ACID (FOLVITE) 1 MG TABLET    Take 1 mg by mouth daily.   LORATADINE (CLARITIN) 10 MG TABLET    Take 10 mg by mouth daily.   LYSINE 1000 MG TABS    Take 1 tablet by mouth daily.   METHOTREXATE 2.5 MG TABLET    Take 15 mg by mouth once a week.   MULTIPLE VITAMINS-MINERALS (MULTI COMPLETE/IRON PO)    Take 1 tablet by mouth 2 (two) times daily.   OMEGA-3 FATTY ACIDS (FISH OIL) 1200 MG CAPS    Take 2 capsules by mouth daily.   PANTOPRAZOLE  (PROTONIX ) 20 MG TABLET    TAKE 1 TABLET IN THE MORNING AND 1 TABLET IN THE EVENING AS NEEDED.   POLYETHYLENE GLYCOL POWDER (GLYCOLAX/MIRALAX) 17 GM/SCOOP POWDER    Take 0.5 Containers by mouth daily.   ROMOSOZUMAB -AQQG (EVENITY ) 105 MG/1. SOSY INJECTION    Inject 210 mg into the skin once.   ROSUVASTATIN (CRESTOR) 20 MG TABLET    TAKE 1 TABLET BY MOUTH EVERY DAY FOR 90 DAYS  Modified Medications   No medications on file  Discontinued Medications   No medications on file    Allergies: Allergies  Allergen Reactions   Zithromax [Azithromycin] Diarrhea   Ceftin [Cefuroxime Axetil] Nausea Only   Tape Rash    Past Medical History: Past Medical History:  Diagnosis Date   Arthritis  Closed fracture dislocation of right elbow    GERD (gastroesophageal reflux disease)    Hypertension    Osteopenia    Rectal prolapse    Rheumatoid arthritis (HCC)    Scoliosis    Shingles    UTI (urinary tract infection)     Social History: Social History   Socioeconomic History   Marital status: Married    Spouse name: Alm   Number of children: 2   Years of education: Not on file   Highest education level: Master's degree (e.g., MA, MS, MEng, MEd, MSW, MBA)  Occupational History   Occupation: retired  Tobacco Use   Smoking status: Never   Smokeless tobacco: Never  Vaping Use   Vaping status: Never Used  Substance and Sexual Activity   Alcohol use:  Not Currently    Alcohol/week: 1.0 standard drink of alcohol    Types: 1 Glasses of wine per week    Comment: occa   Drug use: No   Sexual activity: Not Currently    Partners: Male    Birth control/protection: Post-menopausal    Comment: First sexual encounter at 73 yrs old. Few than 5 partners.  Other Topics Concern   Not on file  Social History Narrative   Adult daughter lives there, she is physically handicapped   Social Drivers of Corporate Investment Banker Strain: Low Risk  (12/08/2023)   Overall Financial Resource Strain (CARDIA)    Difficulty of Paying Living Expenses: Not hard at all  Food Insecurity: No Food Insecurity (12/08/2023)   Hunger Vital Sign    Worried About Running Out of Food in the Last Year: Never true    Ran Out of Food in the Last Year: Never true  Transportation Needs: No Transportation Needs (12/08/2023)   PRAPARE - Administrator, Civil Service (Medical): No    Lack of Transportation (Non-Medical): No  Physical Activity: Sufficiently Active (12/08/2023)   Exercise Vital Sign    Days of Exercise per Week: 7 days    Minutes of Exercise per Session: 60 min  Stress: No Stress Concern Present (12/08/2023)   [employer] of Occupational Health - Occupational Stress Questionnaire    Feeling of Stress: Only a little  Recent Concern: Stress - Stress Concern Present (11/05/2023)   [employer] of Occupational Health - Occupational Stress Questionnaire    Feeling of Stress: To some extent  Social Connections: Socially Integrated (12/08/2023)   Social Connection and Isolation Panel    Frequency of Communication with Friends and Family: More than three times a week    Frequency of Social Gatherings with Friends and Family: Once a week    Attends Religious Services: More than 4 times per year    Active Member of [employer] or Organizations: Yes    Attends Engineer, Structural: More than 4 times per year    Marital Status: Married     Medication: Evenity  (romosozumab -aqqg)  Assessment: Patient is continuing therapy on Evenity  for osteoporosis. She denies any adverse effects.  Vitamin D Lab Results  Component Value Date   VD25OH 72.77 01/05/2024    Calcium Lab Results  Component Value Date   CALCIUM 9.0 01/05/2024   CALCIUM 9.0 01/05/2024    DEXA scan, previous DEXA scan T-score was minus 3.5 (02/04/23).  Plan:  -Counseled patient on purpose, proper use, and adverse effects of Evenity  (romosozumab -aqqg).  Counseled patient that medication must be injected every month by a healthcare professional.  Advised patient to take  calcium 1200 mg daily and vitamin D 800 units daily.  Reviewed the most common adverse effects including risk of major adverse cardiac events, osteonecrosis of the jaw, headache, and muscle/bone pain.  Patient confirms she does not have any major dental work planned at this time.  Reviewed with patient the signs/symptoms of low calcium and advised patient to alert us  if she experiences these symptoms. Reviewed with patient the signs/symptoms of atypical femoral fracture and the need to evaluate new onset thigh, hip or groin pain.  - Patient will discuss with insurance/provider office before continuation of therapy Evenity  (romosozumab -aqqg) to determine most cost effective way to proceed. - If patient elects to continue through pharmacy benefits, Rx will be triaged to [facility] for courier to [facility] clinic.   I discussed the assessment and treatment plan with the patient. The patient was provided an opportunity to ask questions and all were answered. The patient agreed with the plan and demonstrated an understanding of the instructions.   The patient was advised to call back or seek an in-person evaluation if the symptoms worsen or if the condition fails to improve as anticipated.  I provided 20 minutes of non-face-to-face time during this  encounter.  Delon Brow, PharmD, CSP, AAHIVP, CPP Clinical Pharmacist Practitioner - Medication Therapy Disease Management/Specialty Pharmacy Services 01/11/2024, 3:08 PM

## 2024-01-14 DIAGNOSIS — F4323 Adjustment disorder with mixed anxiety and depressed mood: Secondary | ICD-10-CM | POA: Diagnosis not present

## 2024-01-14 NOTE — Telephone Encounter (Signed)
 Call to patient. Patient has not yet had the opportunity to reach out to [facility]. Asking for claim coverage from last injection on 9.25.25. RN advised did not have access to claims/billing, but would asking Arland or Cena to reach out to patient to discuss.

## 2024-01-18 NOTE — Telephone Encounter (Signed)
 [employer] will not give us  the patient's cost for Evenity  for Part B coverage. Following medicare guidelines, coinsurance will be 20% (approximately $332).

## 2024-01-18 NOTE — Telephone Encounter (Signed)
 Spoke with patient. Patient wishes to proceed with Evenity  via medical benefit. Advised would update [facility].

## 2024-01-19 ENCOUNTER — Ambulatory Visit (INDEPENDENT_AMBULATORY_CARE_PROVIDER_SITE_OTHER)

## 2024-01-19 DIAGNOSIS — M810 Age-related osteoporosis without current pathological fracture: Secondary | ICD-10-CM | POA: Diagnosis not present

## 2024-01-19 NOTE — Progress Notes (Signed)
 01/19/24: Patient given injections SQ; both in left arm, per Pt's request. Pt tolerated injection well. (IC,CCMA)

## 2024-01-21 ENCOUNTER — Other Ambulatory Visit: Payer: Self-pay

## 2024-01-21 NOTE — Progress Notes (Signed)
 Patient elected to use buy and bill per office. Disenrolled

## 2024-02-03 DIAGNOSIS — M79631 Pain in right forearm: Secondary | ICD-10-CM | POA: Diagnosis not present

## 2024-02-03 DIAGNOSIS — M79641 Pain in right hand: Secondary | ICD-10-CM | POA: Diagnosis not present

## 2024-02-04 DIAGNOSIS — F4323 Adjustment disorder with mixed anxiety and depressed mood: Secondary | ICD-10-CM | POA: Diagnosis not present

## 2024-02-12 ENCOUNTER — Telehealth: Payer: Self-pay

## 2024-02-12 NOTE — Telephone Encounter (Signed)
 Copied from CRM #[id number]. Topic: Clinical - Medication Question >> Feb 12, 2024 10:43 AM Tinnie C wrote: Reason for CRM: Pt says she accidentally took a double dose of her 10mg  atenolol and benazepril last night. She feels fine, no dizziness. She wants to know if she can take her regular dose tonight or if it would be better to skip it. Please return call at [phone] (she states detailed VM okay and this is okay per pt flags)

## 2024-02-15 NOTE — Telephone Encounter (Signed)
 Yes, that should be ok without having to go to ED for now, but should go if has any dizziness, weakness, falls, or other unusual symptoms  Ok to hold 10 mg atenolol and benazepril for tonight's dose and restart the next day    thanks

## 2024-02-19 ENCOUNTER — Other Ambulatory Visit: Payer: Self-pay | Admitting: Nurse Practitioner

## 2024-02-22 ENCOUNTER — Ambulatory Visit

## 2024-02-22 ENCOUNTER — Ambulatory Visit (INDEPENDENT_AMBULATORY_CARE_PROVIDER_SITE_OTHER)

## 2024-02-22 DIAGNOSIS — M810 Age-related osteoporosis without current pathological fracture: Secondary | ICD-10-CM

## 2024-02-22 MED ORDER — ROMOSOZUMAB-AQQG 105 MG/1.17ML ~~LOC~~ SOSY
210.0000 mg | PREFILLED_SYRINGE | SUBCUTANEOUS | Status: AC
  Administered 2024-02-22: 210 mg via SUBCUTANEOUS

## 2024-02-22 NOTE — Progress Notes (Signed)
 02/22/24: Evenity  #8: Given SQ; both in right arm, per Pt's request. (IC,CCMA)

## 2024-02-27 ENCOUNTER — Other Ambulatory Visit: Payer: Self-pay | Admitting: Nurse Practitioner

## 2024-03-09 ENCOUNTER — Encounter: Payer: Self-pay | Admitting: *Deleted

## 2024-03-09 ENCOUNTER — Other Ambulatory Visit: Payer: Self-pay | Admitting: *Deleted

## 2024-03-09 DIAGNOSIS — M810 Age-related osteoporosis without current pathological fracture: Secondary | ICD-10-CM

## 2024-03-09 MED ORDER — ROMOSOZUMAB-AQQG 105 MG/1.17ML ~~LOC~~ SOSY
210.0000 mg | PREFILLED_SYRINGE | SUBCUTANEOUS | Status: AC
  Administered 2024-03-30: 210 mg via SUBCUTANEOUS

## 2024-03-11 ENCOUNTER — Telehealth: Payer: Self-pay

## 2024-03-11 ENCOUNTER — Other Ambulatory Visit (HOSPITAL_COMMUNITY): Payer: Self-pay

## 2024-03-11 DIAGNOSIS — M810 Age-related osteoporosis without current pathological fracture: Secondary | ICD-10-CM

## 2024-03-11 NOTE — Telephone Encounter (Signed)
 Evenity  VOB initiated via [email or web address]  Last OV:  Next OV:  Last Evenity  inj: 02/22/24 Next Evenity  inj DUE: 03/24/24

## 2024-03-16 ENCOUNTER — Other Ambulatory Visit (HOSPITAL_COMMUNITY): Payer: Self-pay

## 2024-03-16 ENCOUNTER — Other Ambulatory Visit: Payer: Self-pay

## 2024-03-16 MED ORDER — ROMOSOZUMAB-AQQG 105 MG/1.17ML ~~LOC~~ SOSY
210.0000 mg | PREFILLED_SYRINGE | SUBCUTANEOUS | 3 refills | Status: DC
  Filled 2024-03-17: qty 3.51, 30d supply, fill #0

## 2024-03-16 NOTE — Telephone Encounter (Signed)
 SABRA

## 2024-03-16 NOTE — Telephone Encounter (Signed)
 Evenity  #1, 3 RF sent to [facility] along with referral to Pharmacotherapy clinic.

## 2024-03-16 NOTE — Telephone Encounter (Signed)
 PHARMACY APPROVAL      $100

## 2024-03-16 NOTE — Telephone Encounter (Signed)
 Proceed with MTDM services  Med obtained from pharmacy and shipped to clinic:  Pharmacy benefit: Copay $100 (Paid to pharmacy) Admin Fee: $40 (Pay at clinic)  Prior Auth: APPROVED PA#  Expiration Date: 03/02/25  # of doses approved:   Patient NOT eligible for Copay Card. Copay Card can make patient's cost as little as $25. Link to apply: [email or web address]://[email or web address].[email or web address]  ** This summary of benefits is an estimation of the patient's out-of-pocket cost. Exact cost may very based on individual plan coverage.

## 2024-03-16 NOTE — Addendum Note (Signed)
 Addended by: MELITON PERKINS T on: 03/16/2024 12:02 PM   Modules accepted: Orders

## 2024-03-17 ENCOUNTER — Other Ambulatory Visit: Payer: Self-pay

## 2024-03-17 ENCOUNTER — Other Ambulatory Visit (HOSPITAL_COMMUNITY): Payer: Self-pay

## 2024-03-18 ENCOUNTER — Other Ambulatory Visit: Payer: Self-pay

## 2024-03-22 NOTE — Telephone Encounter (Signed)
 Patient returned call, request call back.   Call returned to patient, left message advising Sylvia Reynolds is out of the office, returns on 1/23. Message has been forwarded for call back.

## 2024-03-22 NOTE — Telephone Encounter (Signed)
 Message left to return call to [place] at [phone].

## 2024-03-25 NOTE — Telephone Encounter (Signed)
 See referral

## 2024-03-29 ENCOUNTER — Ambulatory Visit

## 2024-03-30 ENCOUNTER — Ambulatory Visit

## 2024-03-30 DIAGNOSIS — M810 Age-related osteoporosis without current pathological fracture: Secondary | ICD-10-CM | POA: Diagnosis not present

## 2024-06-09 ENCOUNTER — Ambulatory Visit: Admitting: Nurse Practitioner

## 2024-11-09 ENCOUNTER — Ambulatory Visit
